# Patient Record
Sex: Female | Born: 1965 | Race: White | Hispanic: No | Marital: Married | Smoking: Former smoker
Health system: Southern US, Community
[De-identification: ages and names within clinical notes are randomized; demographics above are authoritative.]

## PROBLEM LIST (undated history)

## (undated) DIAGNOSIS — E785 Hyperlipidemia, unspecified: Secondary | ICD-10-CM

## (undated) DIAGNOSIS — Z803 Family history of malignant neoplasm of breast: Secondary | ICD-10-CM

## (undated) HISTORY — DX: Hyperlipidemia, unspecified: E78.5

## (undated) HISTORY — DX: Family history of malignant neoplasm of breast: Z80.3

## (undated) HISTORY — PX: WISDOM TOOTH EXTRACTION: SHX21

## (undated) HISTORY — PX: NO PAST SURGERIES: SHX2092

---

## 2007-07-31 ENCOUNTER — Emergency Department: Payer: Self-pay | Admitting: Internal Medicine

## 2009-01-09 ENCOUNTER — Inpatient Hospital Stay: Payer: Self-pay

## 2011-07-03 ENCOUNTER — Ambulatory Visit: Payer: Self-pay

## 2013-10-18 ENCOUNTER — Ambulatory Visit: Payer: Self-pay

## 2014-03-24 DIAGNOSIS — Z803 Family history of malignant neoplasm of breast: Secondary | ICD-10-CM

## 2014-03-24 HISTORY — DX: Family history of malignant neoplasm of breast: Z80.3

## 2015-01-02 ENCOUNTER — Ambulatory Visit: Payer: Self-pay

## 2015-07-24 ENCOUNTER — Encounter: Payer: Self-pay | Attending: Surgery | Admitting: Surgery

## 2015-07-24 DIAGNOSIS — L03317 Cellulitis of buttock: Secondary | ICD-10-CM | POA: Insufficient documentation

## 2015-07-24 DIAGNOSIS — Z87891 Personal history of nicotine dependence: Secondary | ICD-10-CM | POA: Insufficient documentation

## 2015-07-24 DIAGNOSIS — L98412 Non-pressure chronic ulcer of buttock with fat layer exposed: Secondary | ICD-10-CM | POA: Insufficient documentation

## 2015-07-24 DIAGNOSIS — X58XXXA Exposure to other specified factors, initial encounter: Secondary | ICD-10-CM | POA: Insufficient documentation

## 2015-07-24 DIAGNOSIS — S70362A Insect bite (nonvenomous), left thigh, initial encounter: Secondary | ICD-10-CM | POA: Insufficient documentation

## 2015-07-25 NOTE — Progress Notes (Signed)
Janice, [name] ([id number]) Visit Report for 07/24/2015 Allergy List Details Patient Name: [name], [name]. Date of Service: 07/24/2015 8:45 AM Medical Record Number: [id number] Patient Account Number: [id number] Date of Birth/Sex: Sep 29, 1966 (49 y.o. Female) Treating RN: Montey Hora Primary Care Physician: Other Clinician: Referring Physician: Briscoe Deutscher Treating Physician/Extender: Frann Rider in Treatment: 0 Allergies Active Allergies Bactrim penicillin Allergy Notes Electronic Signature(s) Signed: 07/24/2015 4:36:49 PM By: Montey Hora Entered By: Montey Hora on 07/24/2015 09:08:15 Hanley, Angelmarie Johnette Abraham ([id number]) -------------------------------------------------------------------------------- Arrival Information Details Patient Name: [name]. Date of Service: 07/24/2015 8:45 AM Medical Record Number: [id number] Patient Account Number: [id number] Date of Birth/Sex: Nov 19, 1965 (49 y.o. Female) Treating RN: Montey Hora Primary Care Physician: Other Clinician: Referring Physician: Briscoe Deutscher Treating Physician/Extender: Frann Rider in Treatment: 0 Visit Information Patient Arrived: Ambulatory Arrival Time: 09:06 Accompanied By: self Transfer Assistance: None Patient Identification Verified: Yes Secondary Verification Process Yes Completed: Electronic Signature(s) Signed: 07/24/2015 4:36:49 PM By: Montey Hora Entered By: Montey Hora on 07/24/2015 09:07:50 Cavanah, Blanchie E. ([id number]) -------------------------------------------------------------------------------- Clinic Level of Care Assessment Details Patient Name: [name]. Date of Service: 07/24/2015 8:45 AM Medical Record Number: [id number] Patient Account Number: [id number] Date of Birth/Sex: 28-Dec-1965 (49 y.o. Female) Treating RN: Montey Hora Primary Care Physician: Other Clinician: Referring Physician: Briscoe Deutscher Treating Physician/Extender:  Frann Rider in Treatment: 0 Clinic Level of Care Assessment Items TOOL 1 Quantity Score []  - Use when EandM and Procedure is performed on INITIAL visit 0 ASSESSMENTS - Nursing Assessment / Reassessment X - General Physical Exam (combine w/ comprehensive assessment (listed just 1 20 below) when performed on new pt. evals) X - Comprehensive Assessment (HX, ROS, Risk Assessments, Wounds Hx, etc.) 1 25 ASSESSMENTS - Wound and Skin Assessment / Reassessment []  - Dermatologic / Skin Assessment (not related to wound area) 0 ASSESSMENTS - Ostomy and/or Continence Assessment and Care []  - Incontinence Assessment and Management 0 []  - Ostomy Care Assessment and Management (repouching, etc.) 0 PROCESS - Coordination of Care X - Simple Patient / Family Education for ongoing care 1 15 []  - Complex (extensive) Patient / Family Education for ongoing care 0 X - Staff obtains Programmer, systems, Records, Test Results / Process Orders 1 10 []  - Staff telephones HHA, Nursing Homes / Clarify orders / etc 0 []  - Routine Transfer to another Facility (non-emergent condition) 0 []  - Routine Hospital Admission (non-emergent condition) 0 X - New Admissions / Biomedical engineer / Ordering NPWT, Apligraf, etc. 1 15 []  - Emergency Hospital Admission (emergent condition) 0 PROCESS - Special Needs []  - Pediatric / Minor Patient Management 0 []  - Isolation Patient Management 0 Korver, Lynnea E. ([id number]) []  - Hearing / Language / Visual special needs 0 []  - Assessment of Community assistance (transportation, D/C planning, etc.) 0 []  - Additional assistance / Altered mentation 0 []  - Support Surface(s) Assessment (bed, cushion, seat, etc.) 0 INTERVENTIONS - Miscellaneous []  - External ear exam 0 []  - Patient Transfer (multiple staff / Civil Service fast streamer / Similar devices) 0 []  - Simple Staple / Suture removal (25 or less) 0 []  - Complex Staple / Suture removal (26 or more) 0 []  - Hypo/Hyperglycemic Management  (do not check if billed separately) 0 []  - Ankle / Brachial Index (ABI) - do not check if billed separately 0 Has the patient been seen at the hospital within the last three years: Yes Total Score: 85 Level Of Care: New/Established - Level 3 Electronic Signature(s) Signed: 07/24/2015 4:36:49 PM By: Marjory Lies,  Di Kindle Entered By: Montey Hora on 07/24/2015 09:38:36 Lodato, Edwinna Areola ([id number]) -------------------------------------------------------------------------------- Encounter Discharge Information Details Patient Name: Janice, [name]. Date of Service: 07/24/2015 8:45 AM Medical Record Number: [id number] Patient Account Number: [id number] Date of Birth/Sex: 02-Nov-1966 (49 y.o. Female) Treating RN: Montey Hora Primary Care Physician: Other Clinician: Referring Physician: Briscoe Deutscher Treating Physician/Extender: Frann Rider in Treatment: 0 Encounter Discharge Information Items Discharge Pain Level: 0 Discharge Condition: Stable Ambulatory Status: Ambulatory Discharge Destination: Home Transportation: Private Auto Accompanied By: self Schedule Follow-up Appointment: Yes Medication Reconciliation completed and provided to Patient/Care No Ariany Kesselman: Provided on Clinical Summary of Care: 07/24/2015 Form Type Recipient Paper Patient [facility] Electronic Signature(s) Signed: 07/24/2015 4:36:49 PM By: Montey Hora Previous Signature: 07/24/2015 9:50:10 AM Version By: Ruthine Dose Entered By: Montey Hora on 07/24/2015 09:52:59 Greenlaw, Arrionna E. ([id number]) -------------------------------------------------------------------------------- Multi Wound Chart Details Patient Name: Janice Gong E. Date of Service: 07/24/2015 8:45 AM Medical Record Number: [id number] Patient Account Number: [id number] Date of Birth/Sex: 1966-05-06 (49 y.o. Female) Treating RN: Montey Hora Primary Care Physician: Other Clinician: Referring Physician: Briscoe Deutscher Treating  Physician/Extender: Frann Rider in Treatment: 0 Vital Signs Height(in): 65 Pulse(bpm): 73 Weight(lbs): 168 Blood Pressure 116/62 (mmHg): Body Mass Index(BMI): 28 Temperature(F): 98.0 Respiratory Rate 18 (breaths/min): Photos: [1:No Photos] [N/A:N/A] Wound Location: [1:Left Ischium - Lateral] [N/A:N/A] Wounding Event: [1:Bite] [N/A:N/A] Primary Etiology: [1:Cellulitis] [N/A:N/A] Date Acquired: [1:06/18/2015] [N/A:N/A] Weeks of Treatment: [1:0] [N/A:N/A] Wound Status: [1:Open] [N/A:N/A] Measurements L x W x D 3.5x5.7x0.2 [N/A:N/A] (cm) Area (cm) : [1:15.669] [N/A:N/A] Volume (cm) : [1:3.134] [N/A:N/A] % Reduction in Area: [1:0.00%] [N/A:N/A] % Reduction in Volume: 0.00% [N/A:N/A] Classification: [1:Full Thickness Without Exposed Support Structures] [N/A:N/A] Exudate Amount: [1:Small] [N/A:N/A] Exudate Type: [1:Purulent] [N/A:N/A] Exudate Color: [1:yellow, brown, green] [N/A:N/A] Wound Margin: [1:Flat and Intact] [N/A:N/A] Granulation Amount: [1:None Present (0%)] [N/A:N/A] Necrotic Amount: [1:Large (67-100%)] [N/A:N/A] Necrotic Tissue: [1:Eschar] [N/A:N/A] Exposed Structures: [1:Fascia: No Fat: No Tendon: No Muscle: No Joint: No Bone: No] [N/A:N/A] Limited to Skin Breakdown Epithelialization: None N/A N/A Periwound Skin Texture: Edema: No N/A N/A Excoriation: No Induration: No Callus: No Crepitus: No Fluctuance: No Friable: No Rash: No Scarring: No Periwound Skin Moist: Yes N/A N/A Moisture: Maceration: No Dry/Scaly: No Periwound Skin Color: Erythema: Yes N/A N/A Atrophie Blanche: No Cyanosis: No Ecchymosis: No Hemosiderin Staining: No Mottled: No Pallor: No Rubor: No Erythema Location: Circumferential N/A N/A Temperature: No Abnormality N/A N/A Tenderness on Yes N/A N/A Palpation: Wound Preparation: Ulcer Cleansing: N/A N/A Rinsed/Irrigated with Saline Topical Anesthetic Applied: Other: lidocaine 4% Treatment Notes Electronic  Signature(s) Signed: 07/24/2015 4:36:49 PM By: Montey Hora Entered By: Montey Hora on 07/24/2015 09:33:36 Thammavong, Edwinna Areola ([id number]) -------------------------------------------------------------------------------- [place] Details Patient Name: FRANK, PILGER. Date of Service: 07/24/2015 8:45 AM Medical Record Number: [id number] Patient Account Number: [id number] Date of Birth/Sex: 09/17/1966 (49 y.o. Female) Treating RN: Montey Hora Primary Care Physician: Other Clinician: Referring Physician: Briscoe Deutscher Treating Physician/Extender: Frann Rider in Treatment: 0 Active Inactive Necrotic Tissue Nursing Diagnoses: Impaired tissue integrity related to necrotic/devitalized tissue Goals: Necrotic/devitalized tissue will be minimized in the wound bed Date Initiated: 07/24/2015 Goal Status: Active Patient/caregiver will verbalize understanding of reason and process for debridement of necrotic tissue Date Initiated: 07/24/2015 Goal Status: Active Interventions: Assess patient pain level pre-, during and post procedure and prior to discharge Provide education on necrotic tissue and debridement process Treatment Activities: Apply topical anesthetic as ordered : 07/24/2015 Notes: Orientation to the Wound Care Program Nursing Diagnoses: Knowledge deficit related  to the wound healing center program Goals: Patient/caregiver will verbalize understanding of the [place] Program Date Initiated: 07/24/2015 Goal Status: Active Interventions: Provide education on orientation to the wound center Notes: KAYLEEANN, HUXFORD ([id number]) Wound/Skin Impairment Nursing Diagnoses: Impaired tissue integrity Goals: Ulcer/skin breakdown will have a volume reduction of 30% by week 4 Date Initiated: 07/24/2015 Goal Status: Active Ulcer/skin breakdown will have a volume reduction of 50% by week 8 Date Initiated: 07/24/2015 Goal Status:  Active Ulcer/skin breakdown will have a volume reduction of 80% by week 12 Date Initiated: 07/24/2015 Goal Status: Active Ulcer/skin breakdown will heal within 14 weeks Date Initiated: 07/24/2015 Goal Status: Active Interventions: Assess patient/caregiver ability to obtain necessary supplies Assess ulceration(s) every visit Notes: Electronic Signature(s) Signed: 07/24/2015 4:36:49 PM By: Montey Hora Entered By: Montey Hora on 07/24/2015 09:33:20 Gladney, Edwinna Areola ([id number]) -------------------------------------------------------------------------------- Patient/Caregiver Education Details Patient Name: [name]. Date of Service: 07/24/2015 8:45 AM Medical Record Number: [id number] Patient Account Number: [id number] Date of Birth/Gender: [birth year] (49 y.o. Female) Treating RN: Montey Hora Primary Care Physician: Other Clinician: Referring Physician: Briscoe Deutscher Treating Physician/Extender: Frann Rider in Treatment: 0 Education Assessment Education Provided To: Patient Education Topics Provided Wound/Skin Impairment: Handouts: Other: reoprtable s/s and wound care as ordered Methods: Demonstration, Explain/Verbal Responses: State content correctly Electronic Signature(s) Signed: 07/24/2015 4:36:49 PM By: Montey Hora Entered By: Montey Hora on 07/24/2015 09:53:19 Daponte, Anasia E. ([id number]) -------------------------------------------------------------------------------- Wound Assessment Details Patient Name: Nadara Mustard, Jahlisa E. Date of Service: 07/24/2015 8:45 AM Medical Record Number: [id number] Patient Account Number: [id number] Date of Birth/Sex: Jan 11, 1966 (49 y.o. Female) Treating RN: Montey Hora Primary Care Physician: Other Clinician: Referring Physician: Briscoe Deutscher Treating Physician/Extender: Frann Rider in Treatment: 0 Wound Status Wound Number: 1 Primary Etiology: Cellulitis Wound Location: Left Ischium -  Lateral Wound Status: Open Wounding Event: Bite Date Acquired: 06/18/2015 Weeks Of Treatment: 0 Clustered Wound: No Photos Photo Uploaded By: Montey Hora on 07/24/2015 16:32:56 Wound Measurements Length: (cm) 3.5 Width: (cm) 5.7 Depth: (cm) 0.2 Area: (cm) 15.669 Volume: (cm) 3.134 % Reduction in Area: 0% % Reduction in Volume: 0% Epithelialization: None Tunneling: No Undermining: No Wound Description Full Thickness Without Exposed Classification: Support Structures Wound Margin: Flat and Intact Exudate Small Amount: Exudate Type: Purulent Exudate Color: yellow, brown, green Foul Odor After Cleansing: No Wound Bed Granulation Amount: None Present (0%) Exposed Structure Necrotic Amount: Large (67-100%) Fascia Exposed: No Necrotic Quality: Eschar Fat Layer Exposed: No Mayweather, Anastazja E. ([id number]) Tendon Exposed: No Muscle Exposed: No Joint Exposed: No Bone Exposed: No Limited to Skin Breakdown Periwound Skin Texture Texture Color No Abnormalities Noted: No No Abnormalities Noted: No Callus: No Atrophie Blanche: No Crepitus: No Cyanosis: No Excoriation: No Ecchymosis: No Fluctuance: No Erythema: Yes Friable: No Erythema Location: Circumferential Induration: No Hemosiderin Staining: No Localized Edema: No Mottled: No Rash: No Pallor: No Scarring: No Rubor: No Moisture Temperature / Pain No Abnormalities Noted: No Temperature: No Abnormality Dry / Scaly: No Tenderness on Palpation: Yes Maceration: No Moist: Yes Wound Preparation Ulcer Cleansing: Rinsed/Irrigated with Saline Topical Anesthetic Applied: Other: lidocaine 4%, Treatment Notes Wound #1 (Left, Lateral Ischium) 1. Cleansed with: Clean wound with Normal Saline 2. Anesthetic Topical Lidocaine 4% cream to wound bed prior to debridement 4. Dressing Applied: Aquacel Ag 5. Secondary Dressing Applied ABD Pad 7. Secured with Microbiologist) Signed: 07/24/2015  4:36:49 PM By: Montey Hora Entered By: Montey Hora on 07/24/2015 09:26:26 Shatz, Edwinna Areola ([id number]) -------------------------------------------------------------------------------- [place] Details Patient Name: Nadara Mustard,  Allyce E. Date of Service: 07/24/2015 8:45 AM Medical Record Number: [id number] Patient Account Number: [id number] Date of Birth/Sex: [birth year] (49 y.o. Female) Treating RN: Montey Hora Primary Care Physician: Other Clinician: Referring Physician: Briscoe Deutscher Treating Physician/Extender: Frann Rider in Treatment: 0 Vital Signs Time Taken: 09:12 Temperature (F): 98.0 Height (in): 65 Pulse (bpm): 73 Source: Stated Respiratory Rate (breaths/min): 18 Weight (lbs): 168 Blood Pressure (mmHg): 116/62 Source: Stated Reference Range: 80 - 120 mg / dl Body Mass Index (BMI): 28 Electronic Signature(s) Signed: 07/24/2015 4:36:49 PM By: Montey Hora Entered By: Montey Hora on 07/24/2015 09:15:41

## 2015-07-25 NOTE — Progress Notes (Signed)
GENI, SKORUPSKI ([id number]) Visit Report for 07/24/2015 Abuse/Suicide Risk Screen Details Patient Name: [name]. Date of Service: 07/24/2015 8:45 AM Medical Record Number: [id number] Patient Account Number: [id number] Date of Birth/Sex: [birth year] (49 y.o. Female) Treating RN: Montey Hora Primary Care Physician: Other Clinician: Referring Physician: Briscoe Deutscher Treating Physician/Extender: Frann Rider in Treatment: 0 Abuse/Suicide Risk Screen Items Answer ABUSE/SUICIDE RISK SCREEN: Has anyone close to you tried to hurt or harm you recentlyo No Do you feel uncomfortable with anyone in your familyo No Has anyone forced you do things that you didnot want to doo No Do you have any thoughts of harming yourselfo No Patient displays signs or symptoms of abuse and/or neglect. No Electronic Signature(s) Signed: 07/24/2015 4:36:49 PM By: Montey Hora Entered By: Montey Hora on 07/24/2015 09:11:19 [name], [name]. ([id number]) -------------------------------------------------------------------------------- Activities of Daily Living Details Patient Name: [name], [name]. Date of Service: 07/24/2015 8:45 AM Medical Record Number: [id number] Patient Account Number: [id number] Date of Birth/Sex: 13-Feb-1966 (49 y.o. Female) Treating RN: Montey Hora Primary Care Physician: Other Clinician: Referring Physician: Briscoe Deutscher Treating Physician/Extender: Frann Rider in Treatment: 0 Activities of Daily Living Items Answer Activities of Daily Living (Please select one for each item) Drive Automobile Completely Able Take Medications Completely Able Use Telephone Completely Able Care for Appearance Completely Able Use Toilet Completely Able Bath / Shower Completely Able Dress Self Completely Able Feed Self Completely Able Walk Completely Able Get In / Out Bed Completely Able Housework Completely Able Prepare Meals Completely Able Handle Money  Completely Able Shop for Self Completely Able Electronic Signature(s) Signed: 07/24/2015 4:36:49 PM By: Montey Hora Entered By: Montey Hora on 07/24/2015 09:11:39 [name] ([id number]) -------------------------------------------------------------------------------- Education Assessment Details Patient Name: [name]. Date of Service: 07/24/2015 8:45 AM Medical Record Number: [id number] Patient Account Number: [id number] Date of Birth/Sex: 1966/03/30 (49 y.o. Female) Treating RN: Montey Hora Primary Care Physician: Other Clinician: Referring Physician: Briscoe Deutscher Treating Physician/Extender: Frann Rider in Treatment: 0 Learning Preferences/Education Level/Primary Language Learning Preference: Explanation, Demonstration Highest Education Level: College or Above Preferred Language: English Cognitive Barrier Assessment/Beliefs Language Barrier: No Translator Needed: No Memory Deficit: No Emotional Barrier: No Cultural/Religious Beliefs Affecting Medical No Care: Physical Barrier Assessment Impaired Vision: No Impaired Hearing: No Decreased Hand dexterity: No Knowledge/Comprehension Assessment Knowledge Level: Medium Comprehension Level: Medium Ability to understand written Medium instructions: Ability to understand verbal Medium instructions: Motivation Assessment Anxiety Level: Calm Cooperation: Cooperative Education Importance: Acknowledges Need Interest in Health Problems: Asks Questions Perception: Coherent Willingness to Engage in Self- Medium Management Activities: Readiness to Engage in Self- Medium Management Activities: Electronic Signature(s) Signed: 07/24/2015 4:36:49 PM By: Janice Grandchild, [name] Kitchen ([id number]) Entered By: Montey Hora on 07/24/2015 09:12:05 [name] ([id number]) -------------------------------------------------------------------------------- Fall Risk Assessment  Details Patient Name: [name]. Date of Service: 07/24/2015 8:45 AM Medical Record Number: [id number] Patient Account Number: [id number] Date of Birth/Sex: 05-Dec-1965 (49 y.o. Female) Treating RN: Montey Hora Primary Care Physician: Other Clinician: Referring Physician: Briscoe Deutscher Treating Physician/Extender: Frann Rider in Treatment: 0 Fall Risk Assessment Items FALL RISK ASSESSMENT: History of falling - immediate or within 3 months 0 No Secondary diagnosis 0 No Ambulatory aid None/bed rest/wheelchair/nurse 0 Yes Crutches/cane/walker 0 No Furniture 0 No IV Access/Saline Lock 0 No Gait/Training Normal/bed rest/immobile 0 Yes Weak 0 No Impaired 0 No Mental Status Oriented to own ability 0 Yes Electronic Signature(s) Signed: 07/24/2015 4:36:49 PM By: Montey Hora Entered By: Montey Hora on 07/24/2015 09:12:16  [name] ([id number]) -------------------------------------------------------------------------------- Nutrition Risk Assessment Details Patient Name: [name], ITEN. Date of Service: 07/24/2015 8:45 AM Medical Record Number: [id number] Patient Account Number: [id number] Date of Birth/Sex: Mar 11, 1966 (49 y.o. Female) Treating RN: Montey Hora Primary Care Physician: Other Clinician: Referring Physician: Briscoe Deutscher Treating Physician/Extender: Frann Rider in Treatment: 0 Height (in): Weight (lbs): Body Mass Index (BMI): Nutrition Risk Assessment Items NUTRITION RISK SCREEN: I have an illness or condition that made me change the kind and/or 0 No amount of food I eat I eat fewer than two meals per day 0 No I eat few fruits and vegetables, or milk products 0 No I have three or more drinks of beer, liquor or wine almost every day 0 No I have tooth or mouth problems that make it hard for me to eat 0 No I don't always have enough money to buy the food I need 0 No I eat alone most of the time 0 No I take three or more  different prescribed or over-the-counter drugs a 0 No day Without wanting to, I have lost or gained 10 pounds in the last six 0 No months I am not always physically able to shop, cook and/or feed myself 0 No Nutrition Protocols Good Risk Protocol 0 No interventions needed Moderate Risk Protocol Electronic Signature(s) Signed: 07/24/2015 4:36:49 PM By: Montey Hora Entered By: Montey Hora on 07/24/2015 09:12:26

## 2015-07-25 NOTE — Progress Notes (Signed)
TORREY, HORSEMAN ([id number]) Visit Report for 07/24/2015 Chief Complaint Document Details Patient Name: [name], [name]. Date of Service: 07/24/2015 8:45 AM Medical Record Number: [id number] Patient Account Number: [id number] Date of Birth/Sex: 25-Oct-1966 (49 y.o. Female) Treating RN: Montey Hora Primary Care Physician: Other Clinician: Referring Physician: Briscoe Deutscher Treating Physician/Extender: Frann Rider in Treatment: 0 Information Obtained from: Patient Chief Complaint Patient presents to the wound care center for a consult due non healing wound. She has had a wound on her left upper thigh and buttock region for about a month. Electronic Signature(s) Signed: 07/24/2015 9:56:02 AM By: Christin Fudge MD, FACS Entered By: Christin Fudge on 07/24/2015 09:56:02 Hudler, [name] ([id number]) -------------------------------------------------------------------------------- Debridement Details Patient Name: [name]. Date of Service: 07/24/2015 8:45 AM Medical Record Number: [id number] Patient Account Number: [id number] Date of Birth/Sex: [birth year] (49 y.o. Female) Treating RN: Montey Hora Primary Care Physician: Other Clinician: Referring Physician: Briscoe Deutscher Treating Physician/Extender: Frann Rider in Treatment: 0 Debridement Performed for Wound #1 Left,Lateral Ischium Assessment: Performed By: Physician Pat Patrick., MD Debridement: Debridement Pre-procedure Yes Verification/Time Out Taken: Start Time: 09:36 Pain Control: Lidocaine 4% Topical Solution Level: Skin/Subcutaneous Tissue Total Area Debrided (L x 3.5 (cm) x 5.7 (cm) = 19.95 (cm) W): Tissue and other Viable, Non-Viable, Eschar, Fibrin/Slough, Subcutaneous material debrided: Instrument: Forceps, Scissors Bleeding: Minimum Hemostasis Achieved: Pressure End Time: 09:39 Procedural Pain: 0 Post Procedural Pain: 0 Response to Treatment: Procedure was tolerated  well Post Debridement Measurements of Total Wound Length: (cm) 3.5 Width: (cm) 5.7 Depth: (cm) 0.2 Volume: (cm) 3.134 Post Procedure Diagnosis Same as Pre-procedure Electronic Signature(s) Signed: 07/24/2015 9:55:30 AM By: Christin Fudge MD, FACS Signed: 07/24/2015 4:36:49 PM By: Montey Hora Entered By: Christin Fudge on 07/24/2015 09:55:30 [name], [name] ([id number]) -------------------------------------------------------------------------------- HPI Details Patient Name: Janice Gong E. Date of Service: 07/24/2015 8:45 AM Medical Record Number: [id number] Patient Account Number: [id number] Date of Birth/Sex: Nov 23, 1965 (49 y.o. Female) Treating RN: Montey Hora Primary Care Physician: Other Clinician: Referring Physician: Briscoe Deutscher Treating Physician/Extender: Frann Rider in Treatment: 0 History of Present Illness Location: left lateral upper thigh and hip region Quality: Patient reports experiencing a dull pain to affected area(s). Severity: Patient states wound are getting worse. Duration: Patient has had the wound for < 4 weeks prior to presenting for treatment Timing: Pain in wound is Intermittent (comes and goes Context: The wound occurred when the patient possibly had a insect bite to the left hip and buttock region Modifying Factors: Other treatment(s) tried include: local care was done and recently was put on doxycycline for this. Associated Signs and Symptoms: Wound has purlulent drainage HPI Description: She has no comorbidities and was initially thought to have a insect bite which gradually caused a large purulent abscess with drainage. Due to lack of insurance she did not seek any medical help but continued over-the-counter remedies for this. She was recently seen by her PCP Dr. Briscoe Deutscher who reviewed her wound and besides putting on doxycycline was sent to the wound center. Electronic Signature(s) Signed: 07/24/2015 9:58:32 AM By: Christin Fudge MD, FACS Entered By: Christin Fudge on 07/24/2015 09:58:32 [name], [name] ([id number]) -------------------------------------------------------------------------------- Physical Exam Details Patient Name: [name], [name] Date of Service: 07/24/2015 8:45 AM Medical Record Number: [id number] Patient Account Number: [id number] Date of Birth/Sex: 04/12/1966 (49 y.o. Female) Treating RN: Montey Hora Primary Care Physician: Other Clinician: Referring Physician: Briscoe Deutscher Treating Physician/Extender: Frann Rider in Treatment: 0 Constitutional . Pulse regular. Respirations normal  and unlabored. Afebrile. . Eyes Nonicteric. Reactive to light. Ears, Nose, Mouth, and Throat Lips, teeth, and gums WNL.Marland Kitchen Moist mucosa without lesions . Neck supple and nontender. No palpable supraclavicular or cervical adenopathy. Normal sized without goiter. Respiratory WNL. No retractions.. Cardiovascular Pedal Pulses WNL. No clubbing, cyanosis or edema. Lymphatic No adneopathy. No adenopathy. No adenopathy. Musculoskeletal Adexa without tenderness or enlargement.. Digits and nails w/o clubbing, cyanosis, infection, petechiae, ischemia, or inflammatory conditions.. Integumentary (Hair, Skin) No suspicious lesions. No crepitus or fluctuance. No peri-wound warmth or erythema. No masses.Marland Kitchen Psychiatric Judgement and insight Intact.. No evidence of depression, anxiety, or agitation.. Notes the wound is covered with a large amount of necrotic eschar and subcutaneous debris which will need sharp debridement with forceps and scissors. Electronic Signature(s) Signed: 07/24/2015 9:59:02 AM By: Christin Fudge MD, FACS Entered By: Christin Fudge on 07/24/2015 09:59:02 [name] ([id number]) -------------------------------------------------------------------------------- Physician Orders Details Patient Name: [name]. Date of Service: 07/24/2015 8:45 AM Medical Record  Number: [id number] Patient Account Number: [id number] Date of Birth/Sex: 09/03/1966 (49 y.o. Female) Treating RN: Montey Hora Primary Care Physician: Other Clinician: Referring Physician: Briscoe Deutscher Treating Physician/Extender: Frann Rider in Treatment: 0 Verbal / Phone Orders: Yes Clinician: Montey Hora Read Back and Verified: Yes Diagnosis Coding Wound Cleansing Wound #1 Left,Lateral Ischium o Clean wound with Normal Saline. o May Shower, gently pat wound dry prior to applying new dressing. Anesthetic Wound #1 Left,Lateral Ischium o Topical Lidocaine 4% cream applied to wound bed prior to debridement Primary Wound Dressing Wound #1 Left,Lateral Ischium o Aquacel Ag Secondary Dressing Wound #1 Left,Lateral Ischium o ABD pad - secure with paper tape Dressing Change Frequency Wound #1 Left,Lateral Ischium o Change dressing every other day. Follow-up Appointments Wound #1 Left,Lateral Ischium o Return Appointment in 1 week. Electronic Signature(s) Signed: 07/24/2015 4:01:57 PM By: Christin Fudge MD, FACS Signed: 07/24/2015 4:36:49 PM By: Montey Hora Entered By: Montey Hora on 07/24/2015 09:52:20 Benavidez, Khamya EMarland Kitchen ([id number]) -------------------------------------------------------------------------------- Problem List Details Patient Name: TEONA, VARGUS E. Date of Service: 07/24/2015 8:45 AM Medical Record Number: [id number] Patient Account Number: [id number] Date of Birth/Sex: 1966-07-06 (49 y.o. 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 ([id number]) -------------------------------------------------------------------------------- Progress Note Details Patient Name: [name]. Date of Service: 07/24/2015 8:45 AM Medical Record Number: [id number] Patient Account Number: [id number] Date of Birth/Sex: October 30, 1966 (49 y.o. Female) Treating RN: Montey Hora Primary Care Physician: Other Clinician: Referring Physician: Briscoe Deutscher Treating Physician/Extender: Frann Rider in Treatment: 0 Subjective Chief Complaint Information obtained from Patient Patient presents to the wound care center for a consult due non healing wound. She has had a wound on her left upper thigh and buttock region for about a month. History of Present Illness (HPI) The following HPI elements were documented for the patient's wound: Location: left lateral upper thigh and hip region Quality: Patient reports experiencing a dull pain to affected area(s). Severity: Patient states wound are getting worse. Duration: Patient has had the wound for < 4 weeks prior to presenting for treatment Timing: Pain in wound  is Intermittent (comes and goes Context: The wound occurred when the patient possibly had a insect bite to the left hip and buttock region Modifying Factors: Other treatment(s) tried include: local care was done and recently was put on doxycycline for this. Associated Signs and Symptoms: Wound has purlulent drainage She has no comorbidities and was initially thought to have a insect bite which gradually caused a large purulent abscess with drainage. Due to lack of insurance she did not seek any medical help but continued over-the-counter remedies for this. She was recently seen by her PCP Dr. Briscoe Deutscher who reviewed her wound and  besides putting on doxycycline was sent to the wound center. Wound History Patient presents with 1 open wound that has been present for approximately since August7. Patient has been treating wound in the following manner: TAO and bandage. Laboratory tests have not been performed in the last month. Patient reportedly has not tested positive for an antibiotic resistant organism. Patient reportedly has not tested positive for osteomyelitis. Patient reportedly has not had testing performed to evaluate circulation in the legs. Patient experiences the following problems associated with their wounds: infection, swelling. Patient History Information obtained from Patient. Allergies Bactrim, penicillin [name], [name] ([id number]) Family History Cancer - Father, Heart Disease - Paternal Grandparents, Maternal Grandparents, Hypertension - Maternal Grandparents, Paternal Grandparents, No family history of Diabetes, Hereditary Spherocytosis, Kidney Disease, Lung Disease, Seizures, Stroke, Thyroid Problems, Tuberculosis. Social History Former smoker - 20+ years ago, Marital Status - Married, Alcohol Use - Rarely, Drug Use - No History, Caffeine Use - Never. Medical History Oncologic Denies history of Received Chemotherapy, Received Radiation Review of Systems (ROS) Constitutional Symptoms (General Health) The patient has no complaints or symptoms. Eyes The patient has no complaints or symptoms. Ear/Nose/Mouth/Throat The patient has no complaints or symptoms. Hematologic/Lymphatic The patient has no complaints or symptoms. Respiratory The patient has no complaints or symptoms. Cardiovascular The patient has no complaints or symptoms. Gastrointestinal The patient has no complaints or symptoms. Endocrine The patient has no complaints or symptoms. Genitourinary The patient has no complaints or symptoms. Immunological The patient has no complaints or symptoms. Integumentary (Skin) The  patient has no complaints or symptoms. Musculoskeletal The patient has no complaints or symptoms. Neurologic The patient has no complaints or symptoms. Oncologic The patient has no complaints or symptoms. Psychiatric The patient has no complaints or symptoms. Medications [name], [name] ([id number]) ibuprofen 200 mg tablet oral 1 1 tablet oral doxycycline hyclate 100 mg capsule oral 1 1 capsule oral Objective Constitutional Pulse regular. Respirations normal and unlabored. Afebrile. Vitals Time Taken: 9:12 AM, Height: 65 in, Source: Stated, Weight: 168 lbs, Source: Stated, BMI: 28, Temperature: 98.0 F, Pulse: 73 bpm, Respiratory Rate: 18 breaths/min, Blood Pressure: 116/62 mmHg. Eyes Nonicteric. Reactive to light. Ears, Nose, Mouth, and Throat Lips, teeth, and gums WNL.Marland Kitchen Moist mucosa without lesions . Neck supple and nontender. No palpable supraclavicular or cervical adenopathy. Normal sized without goiter. Respiratory WNL. No retractions.. Cardiovascular Pedal Pulses WNL. No clubbing, cyanosis or edema. Lymphatic No adneopathy. No adenopathy. No adenopathy. Musculoskeletal Adexa without tenderness or enlargement.. Digits and nails w/o clubbing, cyanosis, infection, petechiae, ischemia, or inflammatory conditions.Marland Kitchen Psychiatric Judgement and insight Intact.. No evidence of depression, anxiety, or agitation.. General Notes: the wound is covered with a large amount of necrotic eschar and subcutaneous debris which will need sharp debridement with forceps and scissors. Integumentary (Hair, Skin) No suspicious lesions. No crepitus or fluctuance. No peri-wound warmth or erythema. No masses.. Wound #1  status is Open. Original cause of wound was Bite. The wound is located on the Left,Lateral Ischium. The wound measures 3.5cm length x 5.7cm width x 0.2cm depth; 15.669cm^2 area and Bollig, [name] ([id number]) 3.134cm^3 volume. The wound is limited to skin breakdown. There is  no tunneling or undermining noted. There is a small amount of purulent drainage noted. The wound margin is flat and intact. There is no granulation within the wound bed. There is a large (67-100%) amount of necrotic tissue within the wound bed including Eschar. The periwound skin appearance exhibited: Moist, Erythema. The periwound skin appearance did not exhibit: Callus, Crepitus, Excoriation, Fluctuance, Friable, Induration, Localized Edema, Rash, Scarring, Dry/Scaly, Maceration, Atrophie Blanche, Cyanosis, Ecchymosis, Hemosiderin Staining, Mottled, Pallor, Rubor. The surrounding wound skin color is noted with erythema which is circumferential. Periwound temperature was noted as No Abnormality. The periwound has tenderness on palpation. 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 wound care and appropriate hygiene has been discussed with her and she will come and see me next week. Procedures Wound #1 Wound #1 is a Cellulitis located on the Left,Lateral Ischium . There was a Skin/Subcutaneous Tissue Debridement ([id number]) debridement with total area of 19.95 sq cm performed by Dalton Mille, Jackson Latino., MD. with the following instrument(s): Forceps and Scissors to remove Viable and Non-Viable tissue/material including Fibrin/Slough, Eschar, and Subcutaneous after achieving pain control using Lidocaine 4% Topical Solution. A time out was conducted prior to the start of the procedure. A Minimum amount of bleeding was controlled with Pressure. The procedure was tolerated well with a pain level of 0 throughout and a pain level of 0 following the procedure. Post Debridement Measurements: 3.5cm length x 5.7cm width x 0.2cm  depth; 3.134cm^3 volume. Post procedure Diagnosis Wound #1: Same as Pre-Procedure [name], [name] ([id number]) Plan Wound Cleansing: Wound #1 Left,Lateral Ischium: Clean wound with Normal Saline. May Shower, gently pat wound dry prior to applying new dressing. Anesthetic: Wound #1 Left,Lateral Ischium: Topical Lidocaine 4% cream applied to wound bed prior to debridement Primary Wound Dressing: Wound #1 Left,Lateral Ischium: Aquacel Ag Secondary Dressing: Wound #1 Left,Lateral Ischium: ABD pad - secure with paper tape Dressing Change Frequency: Wound #1 Left,Lateral Ischium: Change dressing every other day. Follow-up Appointments: Wound #1 Left,Lateral Ischium: Return Appointment in 1 week. After sharply debriding all the necrotic tissue I have recommended silver alginate to be changed twice a week. wound care and appropriate hygiene has been discussed with her and she will come and see me next week. Electronic Signature(s) Signed: 07/24/2015 10:00:06 AM By: Christin Fudge MD, FACS Entered By: Christin Fudge on 07/24/2015 10:00:06 [name] ([id number]) -------------------------------------------------------------------------------- ROS/PFSH Details Patient Name: [name]. Date of Service: 07/24/2015 8:45 AM Medical Record Number: [id number] Patient Account Number: [id number] Date of Birth/Sex: [birth year] (49 y.o. Female) Treating RN: Montey Hora Primary Care Physician: Other Clinician: Referring Physician: Briscoe Deutscher Treating Physician/Extender: Frann Rider in Treatment: 0 Information Obtained From Patient Wound History Do you currently have one or more open woundso Yes How many open wounds do you currently haveo 1 Approximately how long have you had your woundso since August7 How have you been treating your wound(s) until nowo TAO and bandage Has your wound(s) ever healed and then re-openedo No Have you had any  lab work done in the  past montho No Have you tested positive for an antibiotic resistant organism (MRSA, VRE)o No Have you tested positive for osteomyelitis (bone infection)o No Have you had any tests for circulation on your legso No Have you had other problems associated with your woundso Infection, Swelling Constitutional Symptoms (General Health) Complaints and Symptoms: No Complaints or Symptoms Eyes Complaints and Symptoms: No Complaints or Symptoms Ear/Nose/Mouth/Throat Complaints and Symptoms: No Complaints or Symptoms Hematologic/Lymphatic Complaints and Symptoms: No Complaints or Symptoms Respiratory Complaints and Symptoms: No Complaints or Symptoms Cardiovascular Riggsbee, [name] ([id number]) Complaints and Symptoms: No Complaints or Symptoms Gastrointestinal Complaints and Symptoms: No Complaints or Symptoms Endocrine Complaints and Symptoms: No Complaints or Symptoms Genitourinary Complaints and Symptoms: No Complaints or Symptoms Immunological Complaints and Symptoms: No Complaints or Symptoms Integumentary (Skin) Complaints and Symptoms: No Complaints or Symptoms Musculoskeletal Complaints and Symptoms: No Complaints or Symptoms Neurologic Complaints and Symptoms: No Complaints or Symptoms Oncologic Complaints and Symptoms: No Complaints or Symptoms Medical History: Negative for: Received Chemotherapy; Received Radiation Psychiatric Complaints and Symptoms: No Complaints or Symptoms Immunizations [name], [name] ([id number]) Immunization Notes: up to date Family and Social History Cancer: Yes - Father; Diabetes: No; Heart Disease: Yes - Paternal Grandparents, Maternal Grandparents; Hereditary Spherocytosis: No; Hypertension: Yes - Maternal Grandparents, Paternal Grandparents; Kidney Disease: No; Lung Disease: No; Seizures: No; Stroke: No; Thyroid Problems: No; Tuberculosis: No; Former smoker - 20+ years ago; Marital Status - Married; Alcohol Use: Rarely;  Drug Use: No History; Caffeine Use: Never; Financial Concerns: No; Food, Clothing or Shelter Needs: No; Support System Lacking: No; Transportation Concerns: No; Advanced Directives: No; Patient does not want information on Advanced Directives Physician Affirmation I have reviewed and agree with the above information. Electronic Signature(s) Signed: 07/24/2015 9:25:36 AM By: Christin Fudge MD, FACS Signed: 07/24/2015 4:36:49 PM By: Montey Hora Entered By: Christin Fudge on 07/24/2015 09:25:35 [name], Janice EMarland Kitchen ([id number]) -------------------------------------------------------------------------------- SuperBill Details Patient Name: MARGRETTA, ZAMORANO E. Date of Service: 07/24/2015 Medical Record Number: [id number] Patient Account Number: [id number] Date of Birth/Sex: 03-27-1966 (49 y.o. Female) Treating RN: Montey Hora Primary Care Physician: Other Clinician: Referring Physician: Briscoe Deutscher Treating Physician/Extender: Frann Rider in Treatment: 0 Diagnosis Coding ICD-10 Codes Code Description L03.317 Cellulitis of buttock L98.412 Non-pressure chronic ulcer of buttock with fat layer exposed S70.362A Insect bite (nonvenomous), left thigh, initial encounter Facility Procedures CPT4 Code: 17793903 Description: 99213 - WOUND CARE VISIT-LEV 3 EST PT Modifier: Quantity: 1 CPT4 Code: 00923300 Description: 76226 - DEB SUBQ TISSUE 20 SQ CM/< ICD-10 Description Diagnosis L03.317 Cellulitis of buttock L98.412 Non-pressure chronic ulcer of buttock with fat la S70.362A Insect bite (nonvenomous), left thigh, initial en Modifier: yer exposed counter Quantity: 1 Physician Procedures CPT4 Code: 3335456 Description: WC PHYS LEVEL 3 o NEW PT ICD-10 Description Diagnosis L03.317 Cellulitis of buttock L98.412 Non-pressure chronic ulcer of buttock with fat l S70.362A Insect bite (nonvenomous), left thigh, initial e Modifier: ayer exposed ncounter Quantity: 1 CPT4 Code: [id number] Henriques,  MICHE Description: 11042 - WC PHYS SUBQ TISS 20 SQ CM ICD-10 Description Diagnosis L03.317 Cellulitis of buttock L98.412 Non-pressure chronic ulcer of buttock with fat l S70.362A Insect bite (nonvenomous), left thigh, initial e LLE E. ([id number]) Modifier: ayer exposed ncounter Quantity: 1 Electronic Signature(s) Signed: 07/24/2015 10:00:44 AM By: Christin Fudge MD, FACS Previous Signature: 07/24/2015 10:00:17 AM Version By: Christin Fudge MD, FACS Entered By: Christin Fudge on 07/24/2015 10:00:44

## 2015-07-31 ENCOUNTER — Encounter: Payer: Self-pay | Admitting: Surgery

## 2015-08-01 NOTE — Progress Notes (Signed)
[name] ([id number]) Visit Report for 07/31/2015 Chief Complaint Document Details Patient Name: [name], [name]. Date of Service: 07/31/2015 9:30 AM Medical Record Number: [id number] Patient Account Number: [id number] Date of Birth/Sex: 1966-04-19 (49 y.o. Female) Treating RN: Cornell Barman Primary Care Physician: Briscoe Deutscher Other Clinician: Referring Physician: Briscoe Deutscher Treating Physician/Extender: Frann Rider in Treatment: 1 Information Obtained from: Patient Chief Complaint Patient presents to the wound care center for a consult due non healing wound. She has had a wound on her left upper thigh and buttock region for about a month. Electronic Signature(s) Signed: 07/31/2015 10:11:31 AM By: Christin Fudge MD, FACS Entered By: Christin Fudge on 07/31/2015 10:11:31 [name] ([id number]) -------------------------------------------------------------------------------- HPI Details Patient Name: [name]. Date of Service: 07/31/2015 9:30 AM Medical Record Number: [id number] Patient Account Number: [id number] Date of Birth/Sex: 1966/01/02 (49 y.o. Female) Treating RN: Cornell Barman Primary Care Physician: Briscoe Deutscher Other Clinician: Referring Physician: Briscoe Deutscher Treating Physician/Extender: Frann Rider in Treatment: 1 History of Present Illness Location: left lateral upper thigh and hip region Quality: Patient reports experiencing a dull pain to affected area(s). Severity: Patient states wound are getting worse. Duration: Patient has had the wound for < 4 weeks prior to presenting for treatment Timing: Pain in wound is Intermittent (comes and goes Context: The wound occurred when the patient possibly had a insect bite to the left hip and buttock region Modifying Factors: Other treatment(s) tried include: local care was done and recently was put on doxycycline for this. Associated Signs and Symptoms: Wound has purlulent  drainage HPI Description: She has no comorbidities and was initially thought to have a insect bite which gradually caused a large purulent abscess with drainage. Due to lack of insurance she did not seek any medical help but continued over-the-counter remedies for this. She was recently seen by her PCP Dr. Briscoe Deutscher who reviewed her wound and besides putting on doxycycline was sent to the wound center. Electronic Signature(s) Signed: 07/31/2015 10:11:36 AM By: Christin Fudge MD, FACS Entered By: Christin Fudge on 07/31/2015 10:11:35 [name] ([id number]) -------------------------------------------------------------------------------- Physical Exam Details Patient Name: [name], [name] Date of Service: 07/31/2015 9:30 AM Medical Record Number: [id number] Patient Account Number: [id number] Date of Birth/Sex: [birth year] (49 y.o. Female) Treating RN: Cornell Barman Primary Care Physician: Briscoe Deutscher Other Clinician: Referring Physician: Briscoe Deutscher Treating Physician/Extender: Frann Rider in Treatment: 1 Constitutional . Pulse regular. Respirations normal and unlabored. Afebrile. . Eyes Nonicteric. Reactive to light. Ears, Nose, Mouth, and Throat Lips, teeth, and gums WNL.Marland Kitchen Moist mucosa without lesions . Neck supple and nontender. No palpable supraclavicular or cervical adenopathy. Normal sized without goiter. Respiratory WNL. No retractions.. Cardiovascular Pedal Pulses WNL. No clubbing, cyanosis or edema. Lymphatic No adneopathy. No adenopathy. No adenopathy. Musculoskeletal Adexa without tenderness or enlargement.. Digits and nails w/o clubbing, cyanosis, infection, petechiae, ischemia, or inflammatory conditions.. Integumentary (Hair, Skin) No suspicious lesions. No crepitus or fluctuance. No peri-wound warmth or erythema. No masses.Marland Kitchen Psychiatric Judgement and insight Intact.. No evidence of depression, anxiety, or agitation.. Notes The wound on the  left hip is looking excellent with good clean granulating tissue and no debridement is required today. Electronic Signature(s) Signed: 07/31/2015 10:12:10 AM By: Christin Fudge MD, FACS Entered By: Christin Fudge on 07/31/2015 10:12:10 [name] ([id number]) -------------------------------------------------------------------------------- Physician Orders Details Patient Name: [name]. Date of Service: 07/31/2015 9:30 AM Medical Record Number: [id number] Patient Account Number: [id number] Date of Birth/Sex: [birth year] (49 y.o. Female) Treating RN:  Cornell Barman Primary Care Physician: Briscoe Deutscher Other Clinician: Referring Physician: Briscoe Deutscher Treating Physician/Extender: Frann Rider in Treatment: 1 Verbal / Phone Orders: Yes Clinician: Cornell Barman Read Back and Verified: Yes Diagnosis Coding Wound Cleansing Wound #1 Left,Lateral Ischium o Clean wound with Normal Saline. Anesthetic Wound #1 Left,Lateral Ischium o Topical Lidocaine 4% cream applied to wound bed prior to debridement Primary Wound Dressing Wound #1 Left,Lateral Ischium o Hydrafera Blue Secondary Dressing Wound #1 Left,Lateral Ischium o ABD pad Dressing Change Frequency Wound #1 Left,Lateral Ischium o Change dressing every other day. Follow-up Appointments Wound #1 Left,Lateral Ischium o Return Appointment in 1 week. Electronic Signature(s) Signed: 07/31/2015 3:58:37 PM By: Christin Fudge MD, FACS Signed: 07/31/2015 4:49:24 PM By: Gretta Cool RN, BSN, Kim RN, BSN Entered By: Gretta Cool, RN, BSN, Kim on 07/31/2015 10:04:48 Analysia, Dungee Edwinna Areola ([id number]) -------------------------------------------------------------------------------- Problem List Details Patient Name: [name]. Date of Service: 07/31/2015 9:30 AM Medical Record Number: [id number] Patient Account Number: [id number] Date of Birth/Sex: 05-17-1966 (49 y.o. 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 ([id number]) -------------------------------------------------------------------------------- Progress Note Details Patient Name: [name]. Date of Service: 07/31/2015 9:30 AM Medical Record Number: [id number] Patient Account Number: [id number] Date of Birth/Sex: 06-09-1966 (49 y.o. Female) Treating RN: Cornell Barman Primary Care Physician: Briscoe Deutscher Other Clinician: Referring Physician: Briscoe Deutscher Treating Physician/Extender: Frann Rider in Treatment: 1 Subjective Chief Complaint Information obtained from Patient Patient presents to the wound care center for a consult due non healing wound. She has had a wound on her left upper thigh and buttock region for about a month. History of Present Illness (HPI) The following HPI elements were documented for the patient's wound: Location: left lateral upper thigh and hip region Quality: Patient reports experiencing a dull pain to affected area(s). Severity: Patient states wound are getting worse. Duration: Patient has had the wound for < 4 weeks prior to presenting for treatment Timing: Pain in wound is Intermittent (comes and goes Context: The wound occurred when the patient possibly had a insect bite to the left hip and buttock region Modifying Factors: Other treatment(s) tried include: local care was done and recently was  put on doxycycline for this. Associated Signs and Symptoms: Wound has purlulent drainage She has no comorbidities and was initially thought to have a insect bite which gradually caused a large purulent abscess with drainage. Due to lack of insurance she did not seek any medical help but continued over-the-counter remedies for this. She was recently seen by her PCP Dr. Briscoe Deutscher who reviewed her wound and besides putting on doxycycline was sent to the wound center. Objective Constitutional Pulse regular. Respirations normal and unlabored. Afebrile. Vitals Time Taken: 9:39 AM, Height: 65 in, Weight: 168 lbs, BMI: 28, Temperature: 98.1 F, Pulse: 70 bpm, Respiratory Rate: 16 breaths/min, Blood Pressure: 122/70 mmHg. Eyes Nonicteric. Reactive to light. Mcclanahan, [place] ([id number]) Ears, Nose, Mouth, and Throat Lips, teeth, and gums WNL.Marland Kitchen Moist mucosa without lesions . Neck supple and nontender. No palpable supraclavicular or cervical adenopathy. Normal sized without  goiter. Respiratory WNL. No retractions.. Cardiovascular Pedal Pulses WNL. No clubbing, cyanosis or edema. Lymphatic No adneopathy. No adenopathy. No adenopathy. Musculoskeletal Adexa without tenderness or enlargement.. Digits and nails w/o clubbing, cyanosis, infection, petechiae, ischemia, or inflammatory conditions.Marland Kitchen Psychiatric Judgement and insight Intact.. No evidence of depression, anxiety, or agitation.. General Notes: The wound on the left hip is looking excellent with good clean granulating tissue and no debridement is required today. Integumentary (Hair, Skin) No suspicious lesions. No crepitus or fluctuance. No peri-wound warmth or erythema. No masses.. Wound #1 status is Open. Original cause of wound was Bite. The wound is located on the Left,Lateral Ischium. The wound measures 3.1cm length x 5cm width x 0.2cm depth; 12.174cm^2 area and 2.435cm^3 volume. The wound is limited to skin breakdown. There is  no tunneling or undermining noted. There is a small amount of serous drainage noted. The wound margin is flat and intact. There is large (67-100%) red granulation within the wound bed. There is a small (1-33%) amount of necrotic tissue within the wound bed including Eschar. The periwound skin appearance exhibited: Moist. The periwound skin appearance did not exhibit: Callus, Crepitus, Excoriation, Fluctuance, Friable, Induration, Localized Edema, Rash, Scarring, Dry/Scaly, Maceration, Atrophie Blanche, Cyanosis, Ecchymosis, Hemosiderin Staining, Mottled, Pallor, Rubor, Erythema. Periwound temperature was noted as No Abnormality. The periwound has tenderness on palpation. Assessment Active Problems ICD-10 L03.317 - Cellulitis of buttock Marcou, Breonia E. 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 She will come back and see me next week. Plan Wound Cleansing: Wound #1 Left,Lateral Ischium: Clean wound with Normal Saline. Anesthetic: Wound #1 Left,Lateral Ischium: Topical Lidocaine 4% cream applied to wound bed prior to debridement Primary Wound Dressing: Wound #1 Left,Lateral Ischium: Hydrafera Blue Secondary Dressing: Wound #1 Left,Lateral Ischium: ABD pad Dressing Change Frequency: Wound #1 Left,Lateral Ischium: Change dressing every other day. Follow-up Appointments: Wound #1 Left,Lateral Ischium: Return Appointment in 1 week. The wound is making good progress and I will change over to [facility] to be changed every other day with a bordered foam dressing. She will come back and see me next week. Electronic Signature(s) Signed: 07/31/2015 10:14:13 AM By: Christin Fudge MD, FACS Entered By: Christin Fudge on 07/31/2015  10:14:13 [name] ([id number]) -------------------------------------------------------------------------------- SuperBill Details Patient Name: [name]. Date of Service: 07/31/2015 Medical Record Number: [id number] Patient Account Number: [id number] Date of Birth/Sex: Jul 30, 1966 (49 y.o. Female) Treating RN: Cornell Barman Primary Care Physician: Briscoe Deutscher Other Clinician: Referring Physician: Briscoe Deutscher Treating Physician/Extender: Frann Rider in Treatment: 1 Diagnosis Coding ICD-10 Codes Code Description L03.317 Cellulitis of buttock L98.412 Non-pressure chronic ulcer of buttock with fat layer exposed S70.362A Insect bite (nonvenomous), left thigh, initial encounter Facility Procedures CPT4 Code: [id number] Description: [phone] - WOUND CARE VISIT-LEV 2 EST PT Modifier: Quantity: 1 Physician Procedures CPT4 Code: [id number] Description: 81275 - WC PHYS LEVEL 3 - EST PT ICD-10 Description Diagnosis L03.317 Cellulitis of buttock L98.412 Non-pressure chronic ulcer of buttock with fat l S70.362A Insect bite (nonvenomous), left thigh, initial e Modifier: ayer exposed ncounter Quantity: 1 Electronic Signature(s) Signed: 07/31/2015 10:14:27 AM By: Christin Fudge MD, FACS Entered By: Christin Fudge on 07/31/2015 10:14:27

## 2015-08-07 ENCOUNTER — Encounter: Payer: Self-pay | Admitting: Surgery

## 2015-08-09 NOTE — Progress Notes (Signed)
[name] ([id number]) Visit Report for 08/07/2015 Chief Complaint Document Details Patient Name: [name], [name]. Date of Service: 08/07/2015 8:45 AM Medical Record Number: [id number] Patient Account Number: [id number] Date of Birth/Sex: October 04, 1966 (49 y.o. Female) Treating RN: Cornell Barman Primary Care Physician: Briscoe Deutscher Other Clinician: Referring Physician: Briscoe Deutscher Treating Physician/Extender: Frann Rider in Treatment: [name] Information Obtained from: Patient Chief Complaint Patient presents to the wound care center for a consult due non healing wound. She has had a wound on her left upper thigh and buttock region for about a month. Electronic Signature(s) Signed: 08/07/2015 9:24:40 AM By: Christin Fudge MD, FACS Entered By: Christin Fudge on 08/07/2015 09:24:40 [name] ([id number]) -------------------------------------------------------------------------------- HPI Details Patient Name: [name], [name] Date of Service: 08/07/2015 8:45 AM Medical Record Number: [id number] Patient Account Number: [id number] Date of Birth/Sex: [birth year] (49 y.o. Female) Treating RN: Cornell Barman Primary Care Physician: Briscoe Deutscher Other Clinician: Referring Physician: Briscoe Deutscher Treating Physician/Extender: Frann Rider in Treatment: [name] History of Present Illness Location: left lateral upper thigh and hip region Quality: Patient reports experiencing a dull pain to affected area(s). Severity: Patient states wound are getting worse. Duration: Patient has had the wound for < 4 weeks prior to presenting for treatment Timing: Pain in wound is Intermittent (comes and goes Context: The wound occurred when the patient possibly had a insect bite to the left hip and buttock region Modifying Factors: Other treatment(s) tried include: local care was done and recently was put on doxycycline for this. Associated Signs and Symptoms: Wound has purlulent  drainage HPI Description: She has no comorbidities and was initially thought to have a insect bite which gradually caused a large purulent abscess with drainage. Due to lack of insurance she did not seek any medical help but continued over-the-counter remedies for this. She was recently seen by her PCP Dr. Briscoe Deutscher who reviewed her wound and besides putting on doxycycline was sent to the wound center. 08/07/2015 -- her wound is doing fine and she has some issues with the dressing sticking to her skin and difficult to remove it. Other than that she is doing fine. Electronic Signature(s) Signed: 08/07/2015 9:25:11 AM By: Christin Fudge MD, FACS Entered By: Christin Fudge on 08/07/2015 09:25:10 [name] ([id number]) -------------------------------------------------------------------------------- Physical Exam Details Patient Name: [name], [name] Constitutional . Pulse regular. Respirations normal and unlabored. Afebrile. . Eyes Nonicteric. Reactive to light. Ears, Nose, Mouth, and Throat Lips, teeth, and gums WNL.Marland Kitchen Moist mucosa without lesions . Neck supple and nontender. No palpable supraclavicular or cervical adenopathy. Normal sized without goiter. Respiratory WNL. No retractions.. Cardiovascular Pedal Pulses WNL. No clubbing, cyanosis or edema. Lymphatic No adneopathy. No adenopathy. No adenopathy. Musculoskeletal Adexa without tenderness or enlargement.. Digits and nails w/o clubbing, cyanosis, infection, petechiae, ischemia, or inflammatory conditions.. Integumentary (Hair, Skin) No suspicious lesions. No crepitus or fluctuance. No  peri-wound warmth or erythema. No masses.Marland Kitchen Psychiatric Judgement and insight Intact.. No evidence of depression, anxiety, or agitation.. Notes the wound is not looking very clean and has come in as far as size goes. Electronic Signature(s) Signed: 08/07/2015 9:25:42 AM By: Christin Fudge MD, FACS Entered By: Christin Fudge on 08/07/2015 09:25:41 [name] ([id number]) -------------------------------------------------------------------------------- Physician Orders Details Patient  Name: [name], [name] Date of Service: 08/07/2015 8:45 AM Medical Record Number: [id number] Patient Account Number: [id number] Date of Birth/Sex: 05-25-1966 (49 y.o. Female) Treating RN: Cornell Barman Primary Care Physician: Briscoe Deutscher Other Clinician: Referring Physician: Briscoe Deutscher Treating Physician/Extender: Frann Rider in Treatment: [name] Verbal / Phone Orders: Yes Clinician: Cornell Barman Read Back and Verified: Yes Diagnosis Coding Wound Cleansing Wound #1 Left,Lateral Ischium o Clean wound with Normal Saline. Anesthetic Wound #1 Left,Lateral Ischium o Topical Lidocaine 4% cream applied to wound bed prior to debridement Primary Wound Dressing Wound #1 Left,Lateral Ischium o Prisma Ag Secondary Dressing Wound #1 Left,Lateral Ischium o Boardered Foam Dressing Dressing Change Frequency Wound #1 Left,Lateral Ischium o Dressing is to be changed Monday and Thursday. Follow-up Appointments Wound #1 Left,Lateral Ischium o Return Appointment in 1 week. Electronic Signature(s) Signed: 08/07/2015 4:29:43 PM By: Christin Fudge MD, FACS Signed: 08/08/2015 5:26:34 PM By: Gretta Cool RN, BSN, Kim RN, BSN Entered By: Gretta Cool, RN, BSN, Kim on 08/07/2015 78:93:81 [name] ([id number]) -------------------------------------------------------------------------------- Problem List Details Patient Name: [name], PITZ. Date of Service: 08/07/2015 8:45 AM Medical Record Number:  [id number] Patient Account Number: [id number] Date of Birth/Sex: 04-28-1966 (49 y.o. 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 ([id number]) -------------------------------------------------------------------------------- Progress Note Details Patient Name: [name]. Date of Service: 08/07/2015 8:45 AM Medical Record Number: [id number] Patient Account Number: [id number] Date of Birth/Sex: 1966-08-30 (49 y.o. Female) Treating RN: Cornell Barman Primary Care Physician: Briscoe Deutscher Other Clinician: Referring Physician: Briscoe Deutscher Treating Physician/Extender: Frann Rider in Treatment: [name] Subjective Chief Complaint Information obtained from Patient Patient presents to the wound care center for a consult due non healing wound. She has had a wound on her left upper thigh and buttock region for about a month. History of Present Illness (HPI) The following HPI elements were documented for the patient's wound: Location: left lateral upper thigh and hip region Quality: Patient reports experiencing a dull pain to affected area(s). Severity: Patient states wound are getting worse. Duration: Patient has had the wound for < 4 weeks prior to presenting for treatment Timing: Pain in wound is Intermittent (comes and goes Context: The wound occurred when the patient possibly had  a insect bite to the left hip and buttock region Modifying Factors: Other treatment(s) tried include: local care was done and recently was put on doxycycline for this. Associated Signs and Symptoms: Wound has purlulent drainage She has no comorbidities and was initially thought to have a insect bite which gradually caused a large purulent abscess with drainage. Due to lack of insurance she did not seek any medical help but continued over-the-counter remedies for this. She was recently seen by her PCP Dr. Briscoe Deutscher who reviewed her wound and besides putting on doxycycline was sent to the wound center. 08/07/2015 -- her wound is doing fine and she has some issues with the dressing sticking to her skin and difficult to remove it. Other than that she is doing fine. Objective Constitutional Pulse regular. Respirations normal and unlabored. Afebrile. Vitals Time  Taken: 9:05 AM, Height: 65 in, Weight: 168 lbs, BMI: 28, Temperature: 98.3 F, Pulse: 70 bpm, Respiratory Rate: 12 breaths/min, Blood Pressure: 119/66 mmHg. [name], [name] ([id number]) Eyes Nonicteric. Reactive to light. Ears, Nose, Mouth, and Throat Lips, teeth, and gums WNL.Marland Kitchen Moist mucosa without lesions . Neck supple and nontender. No palpable supraclavicular or cervical adenopathy. Normal sized without goiter. Respiratory WNL. No retractions.. Cardiovascular Pedal Pulses WNL. No clubbing, cyanosis or edema. Lymphatic No adneopathy. No adenopathy. No adenopathy. Musculoskeletal Adexa without tenderness or enlargement.. Digits and nails w/o clubbing, cyanosis, infection, petechiae, ischemia, or inflammatory conditions.Marland Kitchen Psychiatric Judgement and insight Intact.. No evidence of depression, anxiety, or agitation.. General Notes: the wound is not looking very clean and has come in as far as size goes. Integumentary (Hair, Skin) No suspicious lesions. No crepitus or fluctuance. No peri-wound warmth or erythema. No  masses.. Wound #1 status is Open. Original cause of wound was Bite. The wound is located on the Left,Lateral Ischium. The wound measures [name].5cm length x 4cm width x 0.1cm depth; 7.854cm^[name] area and 0.785cm^3 volume. The wound is limited to skin breakdown. There is no tunneling or undermining noted. There is a small amount of serous drainage noted. The wound margin is flat and intact. There is large (67-100%) red granulation within the wound bed. There is a small (1-33%) amount of necrotic tissue within the wound bed including Eschar. The periwound skin appearance exhibited: Dry/Scaly, Moist. The periwound skin appearance did not exhibit: Callus, Crepitus, Excoriation, Fluctuance, Friable, Induration, Localized Edema, Rash, Scarring, Maceration, Atrophie Blanche, Cyanosis, Ecchymosis, Hemosiderin Staining, Mottled, Pallor, Rubor, Erythema. Periwound temperature was noted as No Abnormality. The periwound has tenderness on palpation. Assessment Active Problems ICD-10 [name], [name]. 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 She will come back to see [place] next week. Plan Wound Cleansing: Wound #1 Left,Lateral Ischium: Clean wound with Normal Saline. Anesthetic: Wound #1 Left,Lateral Ischium: Topical Lidocaine 4% cream applied to wound bed prior to debridement Primary Wound Dressing: Wound #1 Left,Lateral Ischium: Prisma Ag Secondary Dressing: Wound #1 Left,Lateral Ischium: Boardered Foam Dressing Dressing Change Frequency: Wound #1 Left,Lateral Ischium: Dressing is to be changed Monday and Thursday. Follow-up Appointments: Wound #1 Left,Lateral Ischium: Return Appointment in 1 week. I have recommended Prisma Ag and a  bordered foam dressing and due to the lack of insurance she will change this twice a week. She will come back to see [place] next week. Electronic Signature(s) Signed: 08/07/2015 9:26:55 AM By: Christin Fudge MD, FACS [name], [name] ([id number]) Entered By: Christin Fudge on 08/07/2015 09:26:55 [name], [name] ([id number]) -------------------------------------------------------------------------------- SuperBill Details Patient Name: Janice Gong E. Date of Service: 08/07/2015 Medical Record Number: [id number] Patient Account Number: [id number] Date of Birth/Sex: Jan 31, 1966 (49 y.o. Female) Treating RN: Cornell Barman Primary Care Physician: Briscoe Deutscher Other Clinician: Referring Physician: Briscoe Deutscher Treating Physician/Extender: Frann Rider in Treatment: [name] Diagnosis Coding ICD-10 Codes Code Description L03.317 Cellulitis of buttock L98.412 Non-pressure chronic ulcer of buttock with fat layer exposed S70.362A Insect bite (nonvenomous), left thigh, initial encounter Facility Procedures CPT4 Code: 24401027 Description: 25366 - WOUND CARE VISIT-LEV [name] EST PT Modifier: Quantity: 1 Physician Procedures CPT4 Code: 4403474 Description: 25956 - WC PHYS LEVEL 3 - EST PT ICD-10 Description Diagnosis L03.317 Cellulitis  of buttock L98.412 Non-pressure chronic ulcer of buttock with fat l S70.362A Insect bite (nonvenomous), left thigh, initial e Modifier: ayer exposed ncounter Quantity: 1 Electronic Signature(s) Signed: 08/07/2015 9:27:08 AM By: Christin Fudge MD, FACS Entered By: Christin Fudge on 08/07/2015 09:27:07

## 2015-08-14 ENCOUNTER — Encounter: Payer: Self-pay | Attending: Surgery | Admitting: Surgery

## 2015-08-14 DIAGNOSIS — L03317 Cellulitis of buttock: Secondary | ICD-10-CM | POA: Insufficient documentation

## 2015-08-14 DIAGNOSIS — L98412 Non-pressure chronic ulcer of buttock with fat layer exposed: Secondary | ICD-10-CM | POA: Insufficient documentation

## 2015-08-14 DIAGNOSIS — X58XXXD Exposure to other specified factors, subsequent encounter: Secondary | ICD-10-CM | POA: Insufficient documentation

## 2015-08-14 DIAGNOSIS — S70362D Insect bite (nonvenomous), left thigh, subsequent encounter: Secondary | ICD-10-CM | POA: Insufficient documentation

## 2015-08-15 NOTE — Progress Notes (Signed)
[name], [name] ([id number]) Visit Report for 08/14/2015 Arrival Information Details Patient Name: [name]. Date of Service: 08/14/2015 10:00 AM Medical Record Number: [id number] Patient Account Number: [id number] Date of Birth/Sex: 10/02/1966 (49 y.o. Female) Treating RN: Montey Hora Primary Care Physician: Briscoe Deutscher Other Clinician: Referring Physician: Briscoe Deutscher Treating Physician/Extender: Frann Rider in Treatment: 3 Visit Information History Since Last Visit Added or deleted any medications: No Patient Arrived: Ambulatory Any new allergies or adverse reactions: No Arrival Time: 10:13 Had a fall or experienced change in No Accompanied By: self activities of daily living that may affect Transfer Assistance: None risk of falls: Patient Identification Verified: Yes Signs or symptoms of abuse/neglect since last No Secondary Verification Process Yes visito Completed: Hospitalized since last visit: No Pain Present Now: No Electronic Signature(s) Signed: 08/14/2015 4:58:54 PM By: Montey Hora Entered By: Montey Hora on 08/14/2015 10:13:21 Collar, Krisi Johnette [name] ([id number]) -------------------------------------------------------------------------------- Clinic Level of Care Assessment Details Patient Name: [name]. Date of Service: 08/14/2015 10:00 AM Medical Record Number: [id number] Patient Account Number: [id number] Date of Birth/Sex: [birth year] (49 y.o. Female) Treating RN: Montey Hora Primary Care Physician: Briscoe Deutscher Other Clinician: Referring Physician: Briscoe Deutscher Treating Physician/Extender: Frann Rider in Treatment: 3 Clinic Level of Care Assessment Items TOOL 4 Quantity Score []  - Use when only an EandM is performed on FOLLOW-UP visit 0 ASSESSMENTS - Nursing Assessment / Reassessment X - Reassessment of Co-morbidities (includes updates in patient status) 1 10 X - Reassessment of Adherence to Treatment  Plan 1 5 ASSESSMENTS - Wound and Skin Assessment / Reassessment X - Simple Wound Assessment / Reassessment - one wound 1 5 []  - Complex Wound Assessment / Reassessment - multiple wounds 0 []  - Dermatologic / Skin Assessment (not related to wound area) 0 ASSESSMENTS - Focused Assessment []  - Circumferential Edema Measurements - multi extremities 0 []  - Nutritional Assessment / Counseling / Intervention 0 []  - Lower Extremity Assessment (monofilament, tuning fork, pulses) 0 []  - Peripheral Arterial Disease Assessment (using hand held doppler) 0 ASSESSMENTS - Ostomy and/or Continence Assessment and Care []  - Incontinence Assessment and Management 0 []  - Ostomy Care Assessment and Management (repouching, etc.) 0 PROCESS - Coordination of Care X - Simple Patient / Family Education for ongoing care 1 15 []  - Complex (extensive) Patient / Family Education for ongoing care 0 []  - Staff obtains Programmer, systems, Records, Test Results / Process Orders 0 []  - Staff telephones HHA, Nursing Homes / Clarify orders / etc 0 []  - Routine Transfer to another Facility (non-emergent condition) 0 [name]. ([id number]) []  - Routine Hospital Admission (non-emergent condition) 0 []  - New Admissions / Biomedical engineer / Ordering NPWT, Apligraf, etc. 0 []  - Emergency Hospital Admission (emergent condition) 0 X - Simple Discharge Coordination 1 10 []  - Complex (extensive) Discharge Coordination 0 PROCESS - Special Needs []  - Pediatric / Minor Patient Management 0 []  - Isolation Patient Management 0 []  - Hearing / Language / Visual special needs 0 []  - Assessment of Community assistance (transportation, D/C planning, etc.) 0 []  - Additional assistance / Altered mentation 0 []  - Support Surface(s) Assessment (bed, cushion, seat, etc.) 0 INTERVENTIONS - Wound Cleansing / Measurement X - Simple Wound Cleansing - one wound 1 5 []  - Complex Wound Cleansing - multiple wounds 0 X - Wound Imaging  (photographs - any number of wounds) 1 5 []  - Wound Tracing (instead of photographs) 0 X - Simple Wound Measurement - one wound 1 5 []  -  Complex Wound Measurement - multiple wounds 0 INTERVENTIONS - Wound Dressings X - Small Wound Dressing one or multiple wounds 1 10 []  - Medium Wound Dressing one or multiple wounds 0 []  - Large Wound Dressing one or multiple wounds 0 []  - Application of Medications - topical 0 []  - Application of Medications - injection 0 INTERVENTIONS - Miscellaneous []  - External ear exam 0 [name], [name]. ([id number]) []  - Specimen Collection (cultures, biopsies, blood, body fluids, etc.) 0 []  - Specimen(s) / Culture(s) sent or taken to Lab for analysis 0 []  - Patient Transfer (multiple staff / Harrel Lemon Lift / Similar devices) 0 []  - Simple Staple / Suture removal (25 or less) 0 []  - Complex Staple / Suture removal (26 or more) 0 []  - Hypo / Hyperglycemic Management (close monitor of Blood Glucose) 0 []  - Ankle / Brachial Index (ABI) - do not check if billed separately 0 X - Vital Signs 1 5 Has the patient been seen at the hospital within the last three years: Yes Total Score: 75 Level Of Care: New/Established - Level 2 Electronic Signature(s) Signed: 08/14/2015 4:58:54 PM By: Montey Hora Entered By: Montey Hora on 08/14/2015 10:37:42 [name], [name] ([id number]) -------------------------------------------------------------------------------- Encounter Discharge Information Details Patient Name: [name]. Date of Service: 08/14/2015 10:00 AM Medical Record Number: [id number] Patient Account Number: [id number] Date of Birth/Sex: 1966/05/23 (49 y.o. Female) Treating RN: Montey Hora Primary Care Physician: Briscoe Deutscher Other Clinician: Referring Physician: Briscoe Deutscher Treating Physician/Extender: Frann Rider in Treatment: 3 Encounter Discharge Information Items Discharge Pain Level: 0 Discharge Condition: Stable Ambulatory  Status: Ambulatory Discharge Destination: Home Transportation: Private Auto Accompanied By: self Schedule Follow-up Appointment: Yes Medication Reconciliation completed and provided to Patient/Care No Noreen Mackintosh: Provided on Clinical Summary of Care: 08/14/2015 Form Type Recipient Paper Patient [facility] Electronic Signature(s) Signed: 08/14/2015 10:46:42 AM By: Ruthine Dose Entered By: Ruthine Dose on 08/14/2015 10:46:42 Wittler, Kahmya [name]. ([id number]) -------------------------------------------------------------------------------- Multi Wound Chart Details Patient Name: Janice Gong [name]. Date of Service: 08/14/2015 10:00 AM Medical Record Number: [id number] Patient Account Number: [id number] Date of Birth/Sex: 1965/12/26 (49 y.o. Female) Treating RN: Montey Hora Primary Care Physician: Briscoe Deutscher Other Clinician: Referring Physician: Briscoe Deutscher Treating Physician/Extender: Frann Rider in Treatment: 3 Vital Signs Height(in): 65 Pulse(bpm): 76 Weight(lbs): 168 Blood Pressure 131/64 (mmHg): Body Mass Index(BMI): 28 Temperature(F): 98.4 Respiratory Rate 14 (breaths/min): Photos: [1:No Photos] [N/A:N/A] Wound Location: [1:Left Ischium - Lateral] [N/A:N/A] Wounding Event: [1:Bite] [N/A:N/A] Primary Etiology: [1:Cellulitis] [N/A:N/A] Date Acquired: [1:06/18/2015] [N/A:N/A] Weeks of Treatment: [1:3] [N/A:N/A] Wound Status: [1:Open] [N/A:N/A] Classification: [1:Full Thickness Without Exposed Support Structures] [N/A:N/A] Exudate Amount: [1:Small] [N/A:N/A] Exudate Type: [1:Serous] [N/A:N/A] Exudate Color: [1:amber] [N/A:N/A] Wound Margin: [1:Flat and Intact] [N/A:N/A] Granulation Amount: [1:Large (67-100%)] [N/A:N/A] Granulation Quality: [1:Red] [N/A:N/A] Necrotic Amount: [1:Small (1-33%)] [N/A:N/A] Necrotic Tissue: [1:Eschar] [N/A:N/A] Exposed Structures: [1:Fascia: No Fat: No Tendon: No Muscle: No Joint: No Bone: No Limited to Skin Breakdown]  [N/A:N/A] Epithelialization: [1:Small (1-33%)] [N/A:N/A] Periwound Skin Texture: Edema: No [1:Excoriation: No Induration: No] [N/A:N/A] Callus: No Crepitus: No Fluctuance: No Friable: No Rash: No Scarring: No Periwound Skin Moist: Yes N/A N/A Moisture: Dry/Scaly: Yes Maceration: No Periwound Skin Color: Atrophie Blanche: No N/A N/A Cyanosis: No Ecchymosis: No Erythema: No Hemosiderin Staining: No Mottled: No Pallor: No Rubor: No Temperature: No Abnormality N/A N/A Tenderness on Yes N/A N/A Palpation: Wound Preparation: Ulcer Cleansing: N/A N/A Rinsed/Irrigated with Saline Topical Anesthetic Applied: Other: lidocaine 4% Treatment Notes Electronic Signature(s) Signed: 08/14/2015 4:58:54 PM By: Montey Hora  Entered By: Montey Hora on 08/14/2015 10:36:23 [name] ([id number]) -------------------------------------------------------------------------------- [place] Details Patient Name: [name], [name]. Date of Service: 08/14/2015 10:00 AM Medical Record Number: [id number] Patient Account Number: [id number] Date of Birth/Sex: Dec 21, 1965 (49 y.o. Female) Treating RN: Montey Hora Primary Care Physician: Briscoe Deutscher Other Clinician: Referring Physician: Briscoe Deutscher Treating Physician/Extender: Frann Rider in Treatment: 3 Active Inactive Necrotic Tissue Nursing Diagnoses: Impaired tissue integrity related to necrotic/devitalized tissue Goals: Necrotic/devitalized tissue will be minimized in the wound bed Date Initiated: 07/24/2015 Goal Status: Active Patient/caregiver will verbalize understanding of reason and process for debridement of necrotic tissue Date Initiated: 07/24/2015 Goal Status: Active Interventions: Assess patient pain level pre-, during and post procedure and prior to discharge Provide education on necrotic tissue and debridement process Treatment Activities: Apply topical anesthetic as ordered :  08/14/2015 Notes: Orientation to the Wound Care Program Nursing Diagnoses: Knowledge deficit related to the wound healing center program Goals: Patient/caregiver will verbalize understanding of the [place] Program Date Initiated: 07/24/2015 Goal Status: Active Interventions: Provide education on orientation to the wound center Notes: [name], EUTSLER. ([id number]) Wound/Skin Impairment Nursing Diagnoses: Impaired tissue integrity Goals: Ulcer/skin breakdown will have a volume reduction of 30% by week 4 Date Initiated: 07/24/2015 Goal Status: Active Ulcer/skin breakdown will have a volume reduction of 50% by week 8 Date Initiated: 07/24/2015 Goal Status: Active Ulcer/skin breakdown will have a volume reduction of 80% by week 12 Date Initiated: 07/24/2015 Goal Status: Active Ulcer/skin breakdown will heal within 14 weeks Date Initiated: 07/24/2015 Goal Status: Active Interventions: Assess patient/caregiver ability to obtain necessary supplies Assess ulceration(s) every visit Notes: Electronic Signature(s) Signed: 08/14/2015 4:58:54 PM By: Montey Hora Entered By: Montey Hora on 08/14/2015 10:35:53 [name], [name] ([id number]) -------------------------------------------------------------------------------- Patient/Caregiver Education Details Patient Name: [name]. Date of Service: 08/14/2015 10:00 AM Medical Record Number: [id number] Patient Account Number: [id number] Date of Birth/Gender: 02-28-1966 (49 y.o. Female) Treating RN: Montey Hora Primary Care Physician: Briscoe Deutscher Other Clinician: Referring Physician: Briscoe Deutscher Treating Physician/Extender: Frann Rider in Treatment: 3 Education Assessment Education Provided To: Patient Education Topics Provided Wound/Skin Impairment: Handouts: Other: wound care as ordered Methods: Demonstration, Explain/Verbal Responses: State content correctly Electronic Signature(s) Signed:  08/14/2015 10:31:14 AM By: Montey Hora Entered By: Montey Hora on 08/14/2015 10:31:14 [name], [name]. ([id number]) -------------------------------------------------------------------------------- Wound Assessment Details Patient Name: [name], [name]. Date of Service: 08/14/2015 10:00 AM Medical Record Number: [id number] Patient Account Number: [id number] Date of Birth/Sex: December 28, 1965 (49 y.o. Female) Treating RN: Montey Hora Primary Care Physician: Briscoe Deutscher Other Clinician: Referring Physician: Briscoe Deutscher Treating Physician/Extender: Frann Rider in Treatment: 3 Wound Status Wound Number: 1 Primary Etiology: Cellulitis Wound Location: Left, Lateral Ischium Wound Status: Open Wounding Event: Bite Date Acquired: 06/18/2015 Weeks Of Treatment: 3 Clustered Wound: No Photos Photo Uploaded By: Montey Hora on 08/14/2015 10:55:12 Wound Measurements Length: (cm) 2.2 Width: (cm) 3.7 Depth: (cm) 0.1 Area: (cm) 6.393 Volume: (cm) 0.639 % Reduction in Area: 59.2% % Reduction in Volume: 79.6% Epithelialization: Small (1-33%) Tunneling: No Undermining: No Wound Description Full Thickness Without Exposed Classification: Support Structures Wound Margin: Flat and Intact Exudate Small Amount: Exudate Type: Serous Exudate Color: amber Foul Odor After Cleansing: No Wound Bed Granulation Amount: Large (67-100%) Exposed Structure Granulation Quality: Red Fascia Exposed: No Necrotic Amount: Small (1-33%) Fat Layer Exposed: No [name], [name]. ([id number]) Necrotic Quality: Eschar Tendon Exposed: No Muscle Exposed: No Joint Exposed: No Bone Exposed: No Limited to Skin Breakdown Periwound Skin  Texture Texture Color No Abnormalities Noted: No No Abnormalities Noted: No Callus: No Atrophie Blanche: No Crepitus: No Cyanosis: No Excoriation: No Ecchymosis: No Fluctuance: No Erythema: No Friable: No Hemosiderin Staining: No Induration:  No Mottled: No Localized Edema: No Pallor: No Rash: No Rubor: No Scarring: No Temperature / Pain Moisture Temperature: No Abnormality No Abnormalities Noted: No Tenderness on Palpation: Yes Dry / Scaly: Yes Maceration: No Moist: Yes Wound Preparation Ulcer Cleansing: Rinsed/Irrigated with Saline Topical Anesthetic Applied: Other: lidocaine 4%, Treatment Notes Wound #1 (Left, Lateral Ischium) 1. Cleansed with: Clean wound with Normal Saline 2. Anesthetic Topical Lidocaine 4% cream to wound bed prior to debridement 4. Dressing Applied: Prisma Ag 5. Secondary Dressing Applied ABD Pad 7. Secured with Recruitment consultant) Signed: 08/14/2015 4:58:54 PM By: Montey Hora Entered By: Montey Hora on 08/14/2015 10:41:58 [name], Janice EMarland Kitchen ([id number]) -------------------------------------------------------------------------------- [place] Details Patient Name: [name]. Date of Service: 08/14/2015 10:00 AM Medical Record Number: [id number] Patient Account Number: [id number] Date of Birth/Sex: [birth year] (49 y.o. Female) Treating RN: Montey Hora Primary Care Physician: Briscoe Deutscher Other Clinician: Referring Physician: Briscoe Deutscher Treating Physician/Extender: Frann Rider in Treatment: 3 Vital Signs Time Taken: 10:13 Temperature (F): 98.4 Height (in): 65 Pulse (bpm): 76 Weight (lbs): 168 Respiratory Rate (breaths/min): 14 Body Mass Index (BMI): 28 Blood Pressure (mmHg): 131/64 Reference Range: 80 - 120 mg / dl Electronic Signature(s) Signed: 08/14/2015 4:58:54 PM By: Montey Hora Entered By: Montey Hora on 08/14/2015 10:14:55

## 2015-08-15 NOTE — Progress Notes (Signed)
Janice, [name] ([id number]) Visit Report for 07/31/2015 Arrival Information Details Patient Name: [name], [name]. Date of Service: 07/31/2015 9:30 AM Medical Record Number: [id number] Patient Account Number: [id number] Date of Birth/Sex: 09/16/1966 (49 y.o. Female) Treating RN: Junious Dresser Primary Care Physician: Briscoe Deutscher Other Clinician: Referring Physician: Briscoe Deutscher Treating Physician/Extender: Frann Rider in Treatment: 1 Visit Information History Since Last Visit Added or deleted any medications: No Patient Arrived: Ambulatory Any new allergies or adverse reactions: No Arrival Time: 09:38 Had a fall or experienced change in No Accompanied By: self activities of daily living that may affect Transfer Assistance: None risk of falls: Signs or symptoms of abuse/neglect since last No visito Hospitalized since last visit: No Has Dressing in Place as Prescribed: No Pain Present Now: No Electronic Signature(s) Signed: 08/14/2015 4:32:11 PM By: Junious Dresser RN Entered By: Junious Dresser on 07/31/2015 09:39:04 Kretzschmar, Aairah E. ([id number]) -------------------------------------------------------------------------------- Clinic Level of Care Assessment Details Patient Name: [name]. Date of Service: 07/31/2015 9:30 AM Medical Record Number: [id number] Patient Account Number: [id number] Date of Birth/Sex: August 14, 1966 (49 y.o. Female) Treating RN: Cornell Barman Primary Care Physician: Briscoe Deutscher Other Clinician: Referring Physician: Briscoe Deutscher Treating Physician/Extender: Frann Rider in Treatment: 1 Clinic Level of Care Assessment Items TOOL 4 Quantity Score []  - Use when only an EandM is performed on FOLLOW-UP visit 0 ASSESSMENTS - Nursing Assessment / Reassessment []  - Reassessment of Co-morbidities (includes updates in patient status) 0 X - Reassessment of Adherence to Treatment Plan 1 5 ASSESSMENTS - Wound and Skin Assessment /  Reassessment X - Simple Wound Assessment / Reassessment - one wound 1 5 []  - Complex Wound Assessment / Reassessment - multiple wounds 0 []  - Dermatologic / Skin Assessment (not related to wound area) 0 ASSESSMENTS - Focused Assessment []  - Circumferential Edema Measurements - multi extremities 0 []  - Nutritional Assessment / Counseling / Intervention 0 []  - Lower Extremity Assessment (monofilament, tuning fork, pulses) 0 []  - Peripheral Arterial Disease Assessment (using hand held doppler) 0 ASSESSMENTS - Ostomy and/or Continence Assessment and Care []  - Incontinence Assessment and Management 0 []  - Ostomy Care Assessment and Management (repouching, etc.) 0 PROCESS - Coordination of Care X - Simple Patient / Family Education for ongoing care 1 15 []  - Complex (extensive) Patient / Family Education for ongoing care 0 []  - Staff obtains Programmer, systems, Records, Test Results / Process Orders 0 []  - Staff telephones HHA, Nursing Homes / Clarify orders / etc 0 []  - Routine Transfer to another Facility (non-emergent condition) 0 Gronau, Chandelle E. ([id number]) []  - Routine Hospital Admission (non-emergent condition) 0 []  - New Admissions / Biomedical engineer / Ordering NPWT, Apligraf, etc. 0 []  - Emergency Hospital Admission (emergent condition) 0 X - Simple Discharge Coordination 1 10 []  - Complex (extensive) Discharge Coordination 0 PROCESS - Special Needs []  - Pediatric / Minor Patient Management 0 []  - Isolation Patient Management 0 []  - Hearing / Language / Visual special needs 0 []  - Assessment of Community assistance (transportation, D/C planning, etc.) 0 []  - Additional assistance / Altered mentation 0 []  - Support Surface(s) Assessment (bed, cushion, seat, etc.) 0 INTERVENTIONS - Wound Cleansing / Measurement X - Simple Wound Cleansing - one wound 1 5 []  - Complex Wound Cleansing - multiple wounds 0 X - Wound Imaging (photographs - any number of wounds) 1 5 []  - Wound Tracing  (instead of photographs) 0 X - Simple Wound Measurement - one wound 1 5 []  - Complex  Wound Measurement - multiple wounds 0 INTERVENTIONS - Wound Dressings X - Small Wound Dressing one or multiple wounds 1 10 []  - Medium Wound Dressing one or multiple wounds 0 []  - Large Wound Dressing one or multiple wounds 0 []  - Application of Medications - topical 0 []  - Application of Medications - injection 0 INTERVENTIONS - Miscellaneous []  - External ear exam 0 Capes, Luella E. ([id number]) []  - Specimen Collection (cultures, biopsies, blood, body fluids, etc.) 0 []  - Specimen(s) / Culture(s) sent or taken to Lab for analysis 0 []  - Patient Transfer (multiple staff / Harrel Lemon Lift / Similar devices) 0 []  - Simple Staple / Suture removal (25 or less) 0 []  - Complex Staple / Suture removal (26 or more) 0 []  - Hypo / Hyperglycemic Management (close monitor of Blood Glucose) 0 []  - Ankle / Brachial Index (ABI) - do not check if billed separately 0 X - Vital Signs 1 5 Has the patient been seen at the hospital within the last three years: Yes Total Score: 65 Level Of Care: New/Established - Level 2 Electronic Signature(s) Signed: 07/31/2015 4:49:24 PM By: Gretta Cool, RN, BSN, Kim RN, BSN Entered By: Gretta Cool, RN, BSN, Kim on 07/31/2015 10:05:22 [name] ([id number]) -------------------------------------------------------------------------------- Encounter Discharge Information Details Patient Name: [name]. Date of Service: 07/31/2015 9:30 AM Medical Record Number: [id number] Patient Account Number: [id number] Date of Birth/Sex: 11-07-1966 (49 y.o. Female) Treating RN: Junious Dresser Primary Care Physician: Briscoe Deutscher Other Clinician: Referring Physician: Briscoe Deutscher Treating Physician/Extender: Frann Rider in Treatment: 1 Encounter Discharge Information Items Discharge Condition: Stable Ambulatory Status: Ambulatory Discharge Destination:  Home Private Transportation: Auto Accompanied By: seld Schedule Follow-up Appointment: Yes Medication Reconciliation completed and No provided to Patient/Care Provider: Clinical Summary of Care: Electronic Signature(s) Signed: 08/14/2015 4:32:11 PM By: Junious Dresser RN Entered By: Junious Dresser on 07/31/2015 10:17:24 Kesinger, Haeley E. ([id number]) -------------------------------------------------------------------------------- Multi Wound Chart Details Patient Name: [name]. Date of Service: 07/31/2015 9:30 AM Medical Record Number: [id number] Patient Account Number: [id number] Date of Birth/Sex: 1966-08-26 (49 y.o. Female) Treating RN: Cornell Barman Primary Care Physician: Briscoe Deutscher Other Clinician: Referring Physician: Briscoe Deutscher Treating Physician/Extender: Frann Rider in Treatment: 1 Vital Signs Height(in): 65 Pulse(bpm): 70 Weight(lbs): 168 Blood Pressure 122/70 (mmHg): Body Mass Index(BMI): 28 Temperature(F): 98.1 Respiratory Rate 16 (breaths/min): Photos: [1:No Photos] [N/A:N/A] Wound Location: [1:Left Ischium - Lateral] [N/A:N/A] Wounding Event: [1:Bite] [N/A:N/A] Primary Etiology: [1:Cellulitis] [N/A:N/A] Date Acquired: [1:06/18/2015] [N/A:N/A] Weeks of Treatment: [1:1] [N/A:N/A] Wound Status: [1:Open] [N/A:N/A] Measurements L x W x D 3.1x5x0.2 [N/A:N/A] (cm) Area (cm) : [1:12.174] [N/A:N/A] Volume (cm) : [1:2.435] [N/A:N/A] % Reduction in Area: [1:22.30%] [N/A:N/A] % Reduction in Volume: 22.30% [N/A:N/A] Classification: [1:Full Thickness Without Exposed Support Structures] [N/A:N/A] Exudate Amount: [1:Small] [N/A:N/A] Exudate Type: [1:Serous] [N/A:N/A] Exudate Color: [1:amber] [N/A:N/A] Wound Margin: [1:Flat and Intact] [N/A:N/A] Granulation Amount: [1:Large (67-100%)] [N/A:N/A] Granulation Quality: [1:Red] [N/A:N/A] Necrotic Amount: [1:Small (1-33%)] [N/A:N/A] Necrotic Tissue: [1:Eschar] [N/A:N/A] Exposed  Structures: [1:Fascia: No Fat: No Tendon: No Muscle: No Joint: No Bone: No] [N/A:N/A] Limited to Skin Breakdown Epithelialization: Small (1-33%) N/A N/A Periwound Skin Texture: Edema: No N/A N/A Excoriation: No Induration: No Callus: No Crepitus: No Fluctuance: No Friable: No Rash: No Scarring: No Periwound Skin Moist: Yes N/A N/A Moisture: Maceration: No Dry/Scaly: No Periwound Skin Color: Atrophie Blanche: No N/A N/A Cyanosis: No Ecchymosis: No Erythema: No Hemosiderin Staining: No Mottled: No Pallor: No Rubor: No Temperature: No Abnormality N/A N/A Tenderness on Yes N/A N/A Palpation:  Wound Preparation: Ulcer Cleansing: N/A N/A Rinsed/Irrigated with Saline Topical Anesthetic Applied: Other: lidocaine 4% Treatment Notes Electronic Signature(s) Signed: 07/31/2015 4:49:24 PM By: Gretta Cool, RN, BSN, Kim RN, BSN Entered By: Gretta Cool, RN, BSN, Kim on 07/31/2015 10:02:42 [name] ([id number]) -------------------------------------------------------------------------------- [facility] Details Patient Name: ELLANA, KAWA. Date of Service: 07/31/2015 9:30 AM Medical Record Number: [id number] Patient Account Number: [id number] Date of Birth/Sex: 1966-08-22 (49 y.o. Female) Treating RN: Cornell Barman Primary Care Physician: Briscoe Deutscher Other Clinician: Referring Physician: Briscoe Deutscher Treating Physician/Extender: Frann Rider in Treatment: 1 Active Inactive Necrotic Tissue Nursing Diagnoses: Impaired tissue integrity related to necrotic/devitalized tissue Goals: Necrotic/devitalized tissue will be minimized in the wound bed Date Initiated: 07/24/2015 Goal Status: Active Patient/caregiver will verbalize understanding of reason and process for debridement of necrotic tissue Date Initiated: 07/24/2015 Goal Status: Active Interventions: Assess patient pain level pre-, during and post procedure and prior to discharge Provide education on  necrotic tissue and debridement process Treatment Activities: Apply topical anesthetic as ordered : 07/31/2015 Notes: Orientation to the Wound Care Program Nursing Diagnoses: Knowledge deficit related to the wound healing center program Goals: Patient/caregiver will verbalize understanding of the [facility] Date Initiated: 07/24/2015 Goal Status: Active Interventions: Provide education on orientation to the wound center Notes: DEDEE, LISS. ([id number]) Wound/Skin Impairment Nursing Diagnoses: Impaired tissue integrity Goals: Ulcer/skin breakdown will have a volume reduction of 30% by week 4 Date Initiated: 07/24/2015 Goal Status: Active Ulcer/skin breakdown will have a volume reduction of 50% by week 8 Date Initiated: 07/24/2015 Goal Status: Active Ulcer/skin breakdown will have a volume reduction of 80% by week 12 Date Initiated: 07/24/2015 Goal Status: Active Ulcer/skin breakdown will heal within 14 weeks Date Initiated: 07/24/2015 Goal Status: Active Interventions: Assess patient/caregiver ability to obtain necessary supplies Assess ulceration(s) every visit Notes: Electronic Signature(s) Signed: 07/31/2015 4:49:24 PM By: Gretta Cool, RN, BSN, Kim RN, BSN Entered By: Gretta Cool, RN, BSN, Kim on 07/31/2015 10:02:35 [name] ([id number]) -------------------------------------------------------------------------------- Patient/Caregiver Education Details Patient Name: [name]. Date of Service: 07/31/2015 9:30 AM Medical Record Number: [id number] Patient Account Number: [id number] Date of Birth/Gender: [birth year] (49 y.o. Female) Treating RN: Junious Dresser Primary Care Physician: Briscoe Deutscher Other Clinician: Referring Physician: Briscoe Deutscher Treating Physician/Extender: Frann Rider in Treatment: 1 Education Assessment Education Provided To: Patient Education Topics Provided Malignant/Atypical Wounds: Methods:  Explain/Verbal Responses: State content correctly Pain: Methods: Explain/Verbal Responses: State content correctly Wound Debridement: Methods: Explain/Verbal Responses: State content correctly Wound/Skin Impairment: Methods: Explain/Verbal Responses: State content correctly Electronic Signature(s) Signed: 08/14/2015 4:32:11 PM By: Junious Dresser RN Entered By: Junious Dresser on 07/31/2015 10:17:45 Kafer, Ayako E. ([id number]) -------------------------------------------------------------------------------- Wound Assessment Details Patient Name: [name] Date of Service: 07/31/2015 9:30 AM Medical Record Number: [id number] Patient Account Number: [id number] Date of Birth/Sex: [birth year] (49 y.o. Female) Treating RN: Junious Dresser Primary Care Physician: Briscoe Deutscher Other Clinician: Referring Physician: Briscoe Deutscher Treating Physician/Extender: Frann Rider in Treatment: 1 Wound Status Wound Number: 1 Primary Etiology: Cellulitis Wound Location: Left Ischium - Lateral Wound Status: Open Wounding Event: Bite Date Acquired: 06/18/2015 Weeks Of Treatment: 1 Clustered Wound: No Photos Photo Uploaded By: Gretta Cool, RN, BSN, Kim on 07/31/2015 15:09:54 Wound Measurements Length: (cm) 3.1 Width: (cm) 5 Depth: (cm) 0.2 Area: (cm) 12.174 Volume: (cm) 2.435 % Reduction in Area: 22.3% % Reduction in Volume: 22.3% Epithelialization: Small (1-33%) Tunneling: No Undermining: No Wound Description Full Thickness Without Exposed Classification: Support Structures Wound Margin: Flat and Intact Exudate Small Amount:  Exudate Type: Serous Exudate Color: amber Foul Odor After Cleansing: No Wound Bed Granulation Amount: Large (67-100%) Exposed Structure Granulation Quality: Red Fascia Exposed: No Necrotic Amount: Small (1-33%) Fat Layer Exposed: No Prochnow, Lolah E. ([id number]) Necrotic Quality: Eschar Tendon Exposed: No Muscle Exposed: No Joint Exposed:  No Bone Exposed: No Limited to Skin Breakdown Periwound Skin Texture Texture Color No Abnormalities Noted: No No Abnormalities Noted: No Callus: No Atrophie Blanche: No Crepitus: No Cyanosis: No Excoriation: No Ecchymosis: No Fluctuance: No Erythema: No Friable: No Hemosiderin Staining: No Induration: No Mottled: No Localized Edema: No Pallor: No Rash: No Rubor: No Scarring: No Temperature / Pain Moisture Temperature: No Abnormality No Abnormalities Noted: No Tenderness on Palpation: Yes Dry / Scaly: No Maceration: No Moist: Yes Wound Preparation Ulcer Cleansing: Rinsed/Irrigated with Saline Topical Anesthetic Applied: Other: lidocaine 4%, Electronic Signature(s) Signed: 08/14/2015 4:32:11 PM By: Junious Dresser RN Entered By: Junious Dresser on 07/31/2015 09:47:56 Linney, Edwinna Areola ([id number]) -------------------------------------------------------------------------------- Vitals Details Patient Name: [name]. Date of Service: 07/31/2015 9:30 AM Medical Record Number: [id number] Patient Account Number: [id number] Date of Birth/Sex: 12/29/1965 (49 y.o. Female) Treating RN: Junious Dresser Primary Care Physician: Briscoe Deutscher Other Clinician: Referring Physician: Briscoe Deutscher Treating Physician/Extender: Frann Rider in Treatment: 1 Vital Signs Time Taken: 09:39 Temperature (F): 98.1 Height (in): 65 Pulse (bpm): 70 Weight (lbs): 168 Respiratory Rate (breaths/min): 16 Body Mass Index (BMI): 28 Blood Pressure (mmHg): 122/70 Reference Range: 80 - 120 mg / dl Electronic Signature(s) Signed: 08/14/2015 4:32:11 PM By: Junious Dresser RN Entered By: Junious Dresser on 07/31/2015 09:40:20

## 2015-08-15 NOTE — Progress Notes (Signed)
LY, WASS ([id number]) Visit Report for 08/07/2015 Arrival Information Details Patient Name: [name], [name]. Date of Service: 08/07/2015 8:45 AM Medical Record Number: [id number] Patient Account Number: [id number] Date of Birth/Sex: 04/10/1966 (49 y.o. Female) Treating RN: Junious Dresser Primary Care Physician: Briscoe Deutscher Other Clinician: Referring Physician: Briscoe Deutscher Treating Physician/Extender: Frann Rider in Treatment: 2 Visit Information History Since Last Visit Added or deleted any medications: No Patient Arrived: Ambulatory Any new allergies or adverse reactions: No Arrival Time: 09:01 Had a fall or experienced change in No Accompanied By: self activities of daily living that may affect Transfer Assistance: None risk of falls: Patient Identification Verified: Yes Signs or symptoms of abuse/neglect since last No Secondary Verification Process Yes visito Completed: Hospitalized since last visit: No Has Dressing in Place as Prescribed: Yes Pain Present Now: Yes Electronic Signature(s) Signed: 08/14/2015 4:32:11 PM By: Junious Dresser RN Entered By: Junious Dresser on 08/07/2015 09:05:17 [name], [name] ([id number]) -------------------------------------------------------------------------------- Clinic Level of Care Assessment Details Patient Name: [name]. Date of Service: 08/07/2015 8:45 AM Medical Record Number: [id number] Patient Account Number: [id number] Date of Birth/Sex: Sep 01, 1966 (49 y.o. Female) Treating RN: Cornell Barman Primary Care Physician: Briscoe Deutscher Other Clinician: Referring Physician: Briscoe Deutscher Treating Physician/Extender: Frann Rider in Treatment: 2 Clinic Level of Care Assessment Items TOOL 4 Quantity Score []  - Use when only an EandM is performed on FOLLOW-UP visit 0 ASSESSMENTS - Nursing Assessment / Reassessment X - Reassessment of Co-morbidities (includes updates in patient status) 1 10 X -  Reassessment of Adherence to Treatment Plan 1 5 ASSESSMENTS - Janice and Skin Assessment / Reassessment X - Simple Janice Assessment / Reassessment - one Janice 1 5 []  - Complex Janice Assessment / Reassessment - multiple wounds 0 []  - Dermatologic / Skin Assessment (not related to Janice area) 0 ASSESSMENTS - Focused Assessment []  - Circumferential Edema Measurements - multi extremities 0 []  - Nutritional Assessment / Counseling / Intervention 0 []  - Lower Extremity Assessment (monofilament, tuning fork, pulses) 0 []  - Peripheral Arterial Disease Assessment (using hand held doppler) 0 ASSESSMENTS - Ostomy and/or Continence Assessment and Care []  - Incontinence Assessment and Management 0 []  - Ostomy Care Assessment and Management (repouching, etc.) 0 PROCESS - Coordination of Care X - Simple Patient / Family Education for ongoing care 1 15 []  - Complex (extensive) Patient / Family Education for ongoing care 0 []  - Staff obtains Programmer, systems, Records, Test Results / Process Orders 0 []  - Staff telephones HHA, Nursing Homes / Clarify orders / etc 0 []  - Routine Transfer to another Facility (non-emergent condition) 0 [name], [name] ([id number]) []  - Routine Hospital Admission (non-emergent condition) 0 []  - New Admissions / Biomedical engineer / Ordering NPWT, Apligraf, etc. 0 []  - Emergency Hospital Admission (emergent condition) 0 X - Simple Discharge Coordination 1 10 []  - Complex (extensive) Discharge Coordination 0 PROCESS - Special Needs []  - Pediatric / Minor Patient Management 0 []  - Isolation Patient Management 0 []  - Hearing / Language / Visual special needs 0 []  - Assessment of Community assistance (transportation, D/C planning, etc.) 0 []  - Additional assistance / Altered mentation 0 []  - Support Surface(s) Assessment (bed, cushion, seat, etc.) 0 INTERVENTIONS - Janice Cleansing / Measurement X - Simple Janice Cleansing - one Janice 1 5 []  - Complex Janice Cleansing - multiple  wounds 0 X - Janice Imaging (photographs - any number of wounds) 1 5 []  - Janice Tracing (instead of photographs) 0 X - Simple  Janice Measurement - one Janice 1 5 []  - Complex Janice Measurement - multiple wounds 0 INTERVENTIONS - Janice Dressings X - Small Janice Dressing one or multiple wounds 1 10 []  - Medium Janice Dressing one or multiple wounds 0 []  - Large Janice Dressing one or multiple wounds 0 []  - Application of Medications - topical 0 []  - Application of Medications - injection 0 INTERVENTIONS - Miscellaneous []  - External ear exam 0 Madero, Haydyn E. ([id number]) []  - Specimen Collection (cultures, biopsies, blood, body fluids, etc.) 0 []  - Specimen(s) / Culture(s) sent or taken to Lab for analysis 0 []  - Patient Transfer (multiple staff / Harrel Lemon Lift / Similar devices) 0 []  - Simple Staple / Suture removal (25 or less) 0 []  - Complex Staple / Suture removal (26 or more) 0 []  - Hypo / Hyperglycemic Management (close monitor of Blood Glucose) 0 []  - Ankle / Brachial Index (ABI) - do not check if billed separately 0 X - Vital Signs 1 5 Has the patient been seen at the hospital within the last three years: Yes Total Score: 75 Level Of Care: New/Established - Level 2 Electronic Signature(s) Signed: 08/08/2015 5:26:34 PM By: Gretta Cool, RN, BSN, Kim RN, BSN Entered By: Gretta Cool, RN, BSN, Kim on 08/07/2015 09:24:32 [name] ([id number]) -------------------------------------------------------------------------------- Encounter Discharge Information Details Patient Name: [name], [name] Date of Service: 08/07/2015 8:45 AM Medical Record Number: [id number] Patient Account Number: [id number] Date of Birth/Sex: 16-Dec-1965 (49 y.o. Female) Treating RN: Cornell Barman Primary Care Physician: Briscoe Deutscher Other Clinician: Referring Physician: Briscoe Deutscher Treating Physician/Extender: Frann Rider in Treatment: 2 Encounter Discharge Information Items Discharge Condition:  Stable Ambulatory Status: Ambulatory Discharge Destination: Home Transportation: Private Auto Accompanied By: husband Schedule Follow-up Appointment: Yes Medication Reconciliation completed and provided to Patient/Care No Provider: Provided on Clinical Summary of Care: 08/07/2015 Form Type Recipient Paper Patient [facility] Electronic Signature(s) Signed: 08/14/2015 4:32:11 PM By: Junious Dresser RN Previous Signature: 08/07/2015 9:30:37 AM Version By: Ruthine Dose Entered By: Junious Dresser on 08/07/2015 09:35:44 Simkins, Arine E. ([id number]) -------------------------------------------------------------------------------- Multi Janice Chart Details Patient Name: Janice Gong E. Date of Service: 08/07/2015 8:45 AM Medical Record Number: [id number] Patient Account Number: [id number] Date of Birth/Sex: 1966/05/04 (49 y.o. Female) Treating RN: Cornell Barman Primary Care Physician: Briscoe Deutscher Other Clinician: Referring Physician: Briscoe Deutscher Treating Physician/Extender: Frann Rider in Treatment: 2 Vital Signs Height(in): 65 Pulse(bpm): 70 Weight(lbs): 168 Blood Pressure 119/66 (mmHg): Body Mass Index(BMI): 28 Temperature(F): 98.3 Respiratory Rate 12 (breaths/min): Photos: [1:No Photos] [N/A:N/A] Janice Location: [1:Left Ischium - Lateral] [N/A:N/A] Wounding Event: [1:Bite] [N/A:N/A] Primary Etiology: [1:Cellulitis] [N/A:N/A] Date Acquired: [1:06/18/2015] [N/A:N/A] Weeks of Treatment: [1:2] [N/A:N/A] Janice Status: [1:Open] [N/A:N/A] Measurements L x W x D 2.5x4x0.1 [N/A:N/A] (cm) Area (cm) : [1:7.854] [N/A:N/A] Volume (cm) : [1:0.785] [N/A:N/A] % Reduction in Area: [1:49.90%] [N/A:N/A] % Reduction in Volume: 75.00% [N/A:N/A] Classification: [1:Full Thickness Without Exposed Support Structures] [N/A:N/A] Exudate Amount: [1:Small] [N/A:N/A] Exudate Type: [1:Serous] [N/A:N/A] Exudate Color: [1:amber] [N/A:N/A] Janice Margin: [1:Flat and Intact]  [N/A:N/A] Granulation Amount: [1:Large (67-100%)] [N/A:N/A] Granulation Quality: [1:Red] [N/A:N/A] Necrotic Amount: [1:Small (1-33%)] [N/A:N/A] Necrotic Tissue: [1:Eschar] [N/A:N/A] Exposed Structures: [1:Fascia: No Fat: No Tendon: No Muscle: No Joint: No Bone: No] [N/A:N/A] Limited to Skin Breakdown Epithelialization: Small (1-33%) N/A N/A Periwound Skin Texture: Edema: No N/A N/A Excoriation: No Induration: No Callus: No Crepitus: No Fluctuance: No Friable: No Rash: No Scarring: No Periwound Skin Moist: Yes N/A N/A Moisture: Dry/Scaly: Yes Maceration: No Periwound Skin Color: Atrophie Blanche: No  N/A N/A Cyanosis: No Ecchymosis: No Erythema: No Hemosiderin Staining: No Mottled: No Pallor: No Rubor: No Temperature: No Abnormality N/A N/A Tenderness on Yes N/A N/A Palpation: Janice Preparation: Ulcer Cleansing: N/A N/A Rinsed/Irrigated with Saline Topical Anesthetic Applied: Other: lidocaine 4% Treatment Notes Electronic Signature(s) Signed: 08/08/2015 5:26:34 PM By: Gretta Cool, RN, BSN, Kim RN, BSN Entered By: Gretta Cool, RN, BSN, Kim on 08/07/2015 09:22:07 [name] ([id number]) -------------------------------------------------------------------------------- [place] Details Patient Name: [name], [name]. Date of Service: 08/07/2015 8:45 AM Medical Record Number: [id number] Patient Account Number: [id number] Date of Birth/Sex: 12-20-1965 (49 y.o. Female) Treating RN: Cornell Barman Primary Care Physician: Briscoe Deutscher Other Clinician: Referring Physician: Briscoe Deutscher Treating Physician/Extender: Frann Rider in Treatment: 2 Active Inactive Necrotic Tissue Nursing Diagnoses: Impaired tissue integrity related to necrotic/devitalized tissue Goals: Necrotic/devitalized tissue will be minimized in the Janice bed Date Initiated: 07/24/2015 Goal Status: Active Patient/caregiver will verbalize understanding of reason and process for  debridement of necrotic tissue Date Initiated: 07/24/2015 Goal Status: Active Interventions: Assess patient pain level pre-, during and post procedure and prior to discharge Provide education on necrotic tissue and debridement process Treatment Activities: Apply topical anesthetic as ordered : 08/07/2015 Notes: Orientation to the [facility] Nursing Diagnoses: Knowledge deficit related to the [facility] program Goals: Patient/caregiver will verbalize understanding of the [facility] Date Initiated: 07/24/2015 Goal Status: Active Interventions: Provide education on orientation to the [facility] center Notes: [name], [name]. ([id number]) Janice/Skin Impairment Nursing Diagnoses: Impaired tissue integrity Goals: Ulcer/skin breakdown will have a volume reduction of 30% by week 4 Date Initiated: 07/24/2015 Goal Status: Active Ulcer/skin breakdown will have a volume reduction of 50% by week 8 Date Initiated: 07/24/2015 Goal Status: Active Ulcer/skin breakdown will have a volume reduction of 80% by week 12 Date Initiated: 07/24/2015 Goal Status: Active Ulcer/skin breakdown will heal within 14 weeks Date Initiated: 07/24/2015 Goal Status: Active Interventions: Assess patient/caregiver ability to obtain necessary supplies Assess ulceration(s) every visit Notes: Electronic Signature(s) Signed: 08/08/2015 5:26:34 PM By: Gretta Cool, RN, BSN, Kim RN, BSN Entered By: Gretta Cool, RN, BSN, Kim on 08/07/2015 09:21:59 [name], [name] ([id number]) -------------------------------------------------------------------------------- Pain Assessment Details Patient Name: Janice Gong E. Date of Service: 08/07/2015 8:45 AM Medical Record Number: [id number] Patient Account Number: [id number] Date of Birth/Sex: 1966/08/21 (49 y.o. Female) Treating RN: Junious Dresser Primary Care Physician: Briscoe Deutscher Other Clinician: Referring Physician: Briscoe Deutscher Treating  Physician/Extender: Frann Rider in Treatment: 2 Active Problems Location of Pain Severity and Description of Pain Patient Has Paino Yes Site Locations Pain Location: Pain in Ulcers Duration of the Pain. Constant / Intermittento Intermittent Rate the pain. Current Pain Level: 2 Pain Management and Medication Current Pain Management: Electronic Signature(s) Signed: 08/14/2015 4:32:11 PM By: Junious Dresser RN Entered By: Junious Dresser on 08/07/2015 09:05:33 [name] ([id number]) -------------------------------------------------------------------------------- Patient/Caregiver Education Details Patient Name: [name]. Date of Service: 08/07/2015 8:45 AM Medical Record Number: [id number] Patient Account Number: [id number] Date of Birth/Gender: 01-Sep-1966 (49 y.o. Female) Treating RN: Junious Dresser Primary Care Physician: Briscoe Deutscher Other Clinician: Referring Physician: Briscoe Deutscher Treating Physician/Extender: Frann Rider in Treatment: 2 Education Assessment Education Provided To: Patient Education Topics Provided Infection: Methods: Explain/Verbal Responses: State content correctly Janice Debridement: Methods: Explain/Verbal Responses: State content correctly Janice/Skin Impairment: Methods: Explain/Verbal Responses: State content correctly Electronic Signature(s) Signed: 08/14/2015 4:32:11 PM By: Junious Dresser RN Entered By: Junious Dresser on 08/07/2015 [name], [name] ([id number]) -------------------------------------------------------------------------------- Janice Assessment Details Patient Name: Janice Gong  E. Date of Service: 08/07/2015 8:45 AM Medical Record Number: [id number] Patient Account Number: [id number] Date of Birth/Sex: Jul 10, 1966 (49 y.o. Female) Treating RN: Junious Dresser Primary Care Physician: Briscoe Deutscher Other Clinician: Referring Physician: Briscoe Deutscher Treating Physician/Extender: Frann Rider in Treatment: 2 Janice Status Janice Number: 1 Primary Etiology: Cellulitis Janice Location: Left Ischium - Lateral Janice Status: Open Wounding Event: Bite Date Acquired: 06/18/2015 Weeks Of Treatment: 2 Clustered Janice: No Photos Photo Uploaded By: Junious Dresser on 08/07/2015 16:31:09 Janice Measurements Length: (cm) 2.5 Width: (cm) 4 Depth: (cm) 0.1 Area: (cm) 7.854 Volume: (cm) 0.785 % Reduction in Area: 49.9% % Reduction in Volume: 75% Epithelialization: Small (1-33%) Tunneling: No Undermining: No Janice Description Full Thickness Without Exposed Classification: Support Structures Janice Margin: Flat and Intact Exudate Small Amount: Exudate Type: Serous Exudate Color: amber Foul Odor After Cleansing: No Janice Bed Granulation Amount: Large (67-100%) Exposed Structure Granulation Quality: Red Fascia Exposed: No Necrotic Amount: Small (1-33%) Fat Layer Exposed: No [name], Lolamae E. ([id number]) Necrotic Quality: Eschar Tendon Exposed: No Muscle Exposed: No Joint Exposed: No Bone Exposed: No Limited to Skin Breakdown Periwound Skin Texture Texture Color No Abnormalities Noted: No No Abnormalities Noted: No Callus: No Atrophie Blanche: No Crepitus: No Cyanosis: No Excoriation: No Ecchymosis: No Fluctuance: No Erythema: No Friable: No Hemosiderin Staining: No Induration: No Mottled: No Localized Edema: No Pallor: No Rash: No Rubor: No Scarring: No Temperature / Pain Moisture Temperature: No Abnormality No Abnormalities Noted: No Tenderness on Palpation: Yes Dry / Scaly: Yes Maceration: No Moist: Yes Janice Preparation Ulcer Cleansing: Rinsed/Irrigated with Saline Topical Anesthetic Applied: Other: lidocaine 4%, Electronic Signature(s) Signed: 08/14/2015 4:32:11 PM By: Junious Dresser RN Entered By: Junious Dresser on 08/07/2015 09:13:47 Howlett, Floria E.  ([id number]) -------------------------------------------------------------------------------- Vitals Details Patient Name: [name]. Date of Service: 08/07/2015 8:45 AM Medical Record Number: [id number] Patient Account Number: [id number] Date of Birth/Sex: May 28, 1966 (49 y.o. Female) Treating RN: Junious Dresser Primary Care Physician: Briscoe Deutscher Other Clinician: Referring Physician: Briscoe Deutscher Treating Physician/Extender: Frann Rider in Treatment: 2 Vital Signs Time Taken: 09:05 Temperature (F): 98.3 Height (in): 65 Pulse (bpm): 70 Weight (lbs): 168 Respiratory Rate (breaths/min): 12 Body Mass Index (BMI): 28 Blood Pressure (mmHg): 119/66 Reference Range: 80 - 120 mg / dl Electronic Signature(s) Signed: 08/14/2015 4:32:11 PM By: Junious Dresser RN Entered By: Junious Dresser on 08/07/2015 09:06:00

## 2015-08-15 NOTE — Progress Notes (Signed)
RUCHA, WISSINGER ([id number]) Visit Report for 08/14/2015 Chief Complaint Document Details Patient Name: [name], [name]. Date of Service: 08/14/2015 10:00 AM Medical Record Number: [id number] Patient Account Number: [id number] Date of Birth/Sex: 05-19-1966 (49 y.o. Female) Treating RN: Montey Hora Primary Care Physician: Briscoe Deutscher Other Clinician: Referring Physician: Briscoe Deutscher Treating Physician/Extender: Frann Rider in Treatment: 3 Information Obtained from: Patient Chief Complaint Patient presents to the wound care center for a consult due non healing wound. She has had a wound on her left upper thigh and buttock region for about a month. Electronic Signature(s) Signed: 08/14/2015 10:44:47 AM By: Christin Fudge MD, FACS Entered By: Christin Fudge on 08/14/2015 10:44:46 Deyton, Edwinna Areola ([id number]) -------------------------------------------------------------------------------- HPI Details Patient Name: [name], [name] Date of Service: 08/14/2015 10:00 AM Medical Record Number: [id number] Patient Account Number: [id number] Date of Birth/Sex: Dec 06, 1965 (49 y.o. Female) Treating RN: Montey Hora Primary Care Physician: Briscoe Deutscher Other Clinician: Referring Physician: Briscoe Deutscher Treating Physician/Extender: Frann Rider in Treatment: 3 History of Present Illness Location: left lateral upper thigh and hip region Quality: Patient reports experiencing a dull pain to affected area(s). Severity: Patient states wound are getting worse. Duration: Patient has had the wound for < 4 weeks prior to presenting for treatment Timing: Pain in wound is Intermittent (comes and goes Context: The wound occurred when the patient possibly had a insect bite to the left hip and buttock region Modifying Factors: Other treatment(s) tried include: local care was done and recently was put on doxycycline for this. Associated Signs and Symptoms: Wound has purlulent  drainage HPI Description: She has no comorbidities and was initially thought to have a insect bite which gradually caused a large purulent abscess with drainage. Due to lack of insurance she did not seek any medical help but continued over-the-counter remedies for this. She was recently seen by her PCP Dr. Briscoe Deutscher who reviewed her wound and besides putting on doxycycline was sent to the wound center. 08/07/2015 -- her wound is doing fine and she has some issues with the dressing sticking to her skin and difficult to remove it. Other than that she is doing fine. Electronic Signature(s) Signed: 08/14/2015 10:44:56 AM By: Christin Fudge MD, FACS Entered By: Christin Fudge on 08/14/2015 10:44:56 [name] ([id number]) -------------------------------------------------------------------------------- Physical Exam Details Patient Name: [name], [name] Date of Service: 08/14/2015 10:00 AM Medical Record Number: [id number] Patient Account Number: [id number] Date of Birth/Sex: [birth year] (49 y.o. Female) Treating RN: Montey Hora Primary Care Physician: Briscoe Deutscher Other Clinician: Referring Physician: Briscoe Deutscher Treating Physician/Extender: Frann Rider in Treatment: 3 Constitutional . Pulse regular. Respirations normal and unlabored. Afebrile. . Eyes Nonicteric. Reactive to light. Ears, Nose, Mouth, and Throat Lips, teeth, and gums WNL.Marland Kitchen Moist mucosa without lesions . Neck supple and nontender. No palpable supraclavicular or cervical adenopathy. Normal sized without goiter. Respiratory WNL. No retractions.. Cardiovascular Pedal Pulses WNL. No clubbing, cyanosis or edema. Lymphatic No adneopathy. No adenopathy. No adenopathy. Musculoskeletal Adexa without tenderness or enlargement.. Digits and nails w/o clubbing, cyanosis, infection, petechiae, ischemia, or inflammatory conditions.. Integumentary (Hair, Skin) No suspicious lesions. No crepitus or  fluctuance. No peri-wound warmth or erythema. No masses.Marland Kitchen Psychiatric Judgement and insight Intact.. No evidence of depression, anxiety, or agitation.. Notes the wound looks good and has healthy granulation tissue and is smaller. Electronic Signature(s) Signed: 08/14/2015 10:45:37 AM By: Christin Fudge MD, FACS Entered By: Christin Fudge on 08/14/2015 10:45:36 [name] ([id number]) -------------------------------------------------------------------------------- Physician Orders Details Patient Name: [name].  Date of Service: 08/14/2015 10:00 AM Medical Record Number: [id number] Patient Account Number: [id number] Date of Birth/Sex: [birth year] (49 y.o. Female) Treating RN: Montey Hora Primary Care Physician: Briscoe Deutscher Other Clinician: Referring Physician: Briscoe Deutscher Treating Physician/Extender: Frann Rider in Treatment: 3 Verbal / Phone Orders: Yes Clinician: Montey Hora Read Back and Verified: Yes Diagnosis Coding Wound Cleansing Wound #1 Left,Lateral Ischium o Clean wound with Normal Saline. Anesthetic Wound #1 Left,Lateral Ischium o Topical Lidocaine 4% cream applied to wound bed prior to debridement Primary Wound Dressing Wound #1 Left,Lateral Ischium o Prisma Ag Secondary Dressing Wound #1 Left,Lateral Ischium o ABD pad Dressing Change Frequency Wound #1 Left,Lateral Ischium o Dressing is to be changed Monday and Thursday. Follow-up Appointments Wound #1 Left,Lateral Ischium o Return Appointment in 2 weeks. Electronic Signature(s) Signed: 08/14/2015 4:36:30 PM By: Christin Fudge MD, FACS Signed: 08/14/2015 4:58:54 PM By: Montey Hora Entered By: Montey Hora on 08/14/2015 10:36:52 Pedretti, Azariyah EMarland Kitchen ([id number]) -------------------------------------------------------------------------------- Problem List Details Patient Name: [name], [name] Date of Service: 08/14/2015 10:00 AM Medical Record Number:  [id number] Patient Account Number: [id number] Date of Birth/Sex: February 20, 1966 (49 y.o. 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 ([id number]) -------------------------------------------------------------------------------- Progress Note Details Patient Name: Janice Gong E. Date of Service: 08/14/2015 10:00 AM Medical Record Number: [id number] Patient Account Number: [id number] Date of Birth/Sex: Nov 11, 1966 (49 y.o. Female) Treating RN: Montey Hora Primary Care Physician: Briscoe Deutscher Other Clinician: Referring Physician: Briscoe Deutscher Treating Physician/Extender: Frann Rider in Treatment: 3 Subjective Chief Complaint Information obtained from Patient Patient presents to the wound care center for a consult due non healing wound. She has had a wound on her left upper thigh and buttock region for about a month. History of Present Illness (HPI) The following HPI elements were documented for the patient's wound: Location: left lateral upper thigh and hip region Quality: Patient reports experiencing a dull pain to affected area(s). Severity: Patient states wound are getting worse. Duration: Patient has had the wound for < 4 weeks prior to presenting for treatment Timing: Pain in wound is Intermittent (comes and goes Context: The wound occurred when the patient  possibly had a insect bite to the left hip and buttock region Modifying Factors: Other treatment(s) tried include: local care was done and recently was put on doxycycline for this. Associated Signs and Symptoms: Wound has purlulent drainage She has no comorbidities and was initially thought to have a insect bite which gradually caused a large purulent abscess with drainage. Due to lack of insurance she did not seek any medical help but continued over-the-counter remedies for this. She was recently seen by her PCP Dr. Briscoe Deutscher who reviewed her wound and besides putting on doxycycline was sent to the wound center. 08/07/2015 -- her wound is doing fine and she has some issues with the dressing sticking to her skin and difficult to remove it. Other than that she is doing fine. Objective Constitutional Pulse regular. Respirations normal and unlabored. Afebrile. Vitals Time Taken: 10:13 AM, Height: 65 in, Weight: 168 lbs, BMI: 28,  Temperature: 98.4 F, Pulse: 76 bpm, Respiratory Rate: 14 breaths/min, Blood Pressure: 131/64 mmHg. [name], [name] ([id number]) Eyes Nonicteric. Reactive to light. Ears, Nose, Mouth, and Throat Lips, teeth, and gums WNL.Marland Kitchen Moist mucosa without lesions . Neck supple and nontender. No palpable supraclavicular or cervical adenopathy. Normal sized without goiter. Respiratory WNL. No retractions.. Cardiovascular Pedal Pulses WNL. No clubbing, cyanosis or edema. Lymphatic No adneopathy. No adenopathy. No adenopathy. Musculoskeletal Adexa without tenderness or enlargement.. Digits and nails w/o clubbing, cyanosis, infection, petechiae, ischemia, or inflammatory conditions.Marland Kitchen Psychiatric Judgement and insight Intact.. No evidence of depression, anxiety, or agitation.. General Notes: the wound looks good and has healthy granulation tissue and is smaller. Integumentary (Hair, Skin) No suspicious lesions. No crepitus or fluctuance. No peri-wound warmth or  erythema. No masses.. Wound #1 status is Open. Original cause of wound was Bite. The wound is located on the Left,Lateral Ischium. The wound measures 2.2cm length x 3.7cm width x 0.1cm depth; 6.393cm^2 area and 0.639cm^3 volume. The wound is limited to skin breakdown. There is no tunneling or undermining noted. There is a small amount of serous drainage noted. The wound margin is flat and intact. There is large (67-100%) red granulation within the wound bed. There is a small (1-33%) amount of necrotic tissue within the wound bed including Eschar. The periwound skin appearance exhibited: Dry/Scaly, Moist. The periwound skin appearance did not exhibit: Callus, Crepitus, Excoriation, Fluctuance, Friable, Induration, Localized Edema, Rash, Scarring, Maceration, Atrophie Blanche, Cyanosis, Ecchymosis, Hemosiderin Staining, Mottled, Pallor, Rubor, Erythema. Periwound temperature was noted as No Abnormality. The periwound has tenderness on palpation. Assessment Active Problems [name], [name]. 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 Plan Wound Cleansing: Wound #1 Left,Lateral Ischium: Clean wound with Normal Saline. Anesthetic: Wound #1 Left,Lateral Ischium: Topical Lidocaine 4% cream applied to wound bed prior to debridement Primary Wound Dressing: Wound #1 Left,Lateral Ischium: Prisma Ag Secondary Dressing: Wound #1 Left,Lateral Ischium: ABD pad Dressing Change Frequency: Wound #1 Left,Lateral Ischium: Dressing is to be changed Monday and Thursday. Follow-up Appointments: Wound #1 Left,Lateral Ischium: Return Appointment in 2 weeks. I have recommended we  continue silver collagen and this can be changed on alternate days and due to lack of insurance she would like to come back to see as in 2 weeks' time. Electronic Signature(s) Signed: 08/14/2015 10:46:08 AM By: Christin Fudge MD, FACS Entered By: Christin Fudge on 08/14/2015 10:46:08 LISAANN, ATHA ([id number]) Geier, Edwinna Areola ([id number]) -------------------------------------------------------------------------------- SuperBill Details Patient Name: [name]. Date of Service: 08/14/2015 Medical Record Number: [id number] Patient Account Number: [id number] Date of Birth/Sex: 09/27/1966 (49 y.o. Female) Treating RN: Montey Hora Primary Care Physician: Briscoe Deutscher Other Clinician: Referring Physician: Briscoe Deutscher Treating Physician/Extender: Frann Rider in Treatment: 3 Diagnosis Coding ICD-10 Codes Code Description L03.317 Cellulitis of buttock L98.412 Non-pressure chronic ulcer of buttock with fat layer exposed S70.362A Insect bite (nonvenomous), left thigh, initial encounter Facility Procedures CPT4 Code: 14431540 Description: 08676 - WOUND CARE VISIT-LEV 2 EST PT Modifier: Quantity: 1 Physician Procedures CPT4 Code: 1950932 Description: 67124 - WC PHYS LEVEL 3 - EST PT ICD-10 Description Diagnosis L98.412 Non-pressure chronic ulcer of buttock with fat l S70.362A Insect bite (nonvenomous), left thigh, initial  e Modifier: ayer exposed ncounter Quantity: 1 Electronic Signature(s) Signed: 08/14/2015 10:46:29 AM By: Christin Fudge MD, FACS Entered By: Christin Fudge on 08/14/2015 10:46:29

## 2015-08-28 ENCOUNTER — Encounter: Payer: Self-pay | Admitting: Surgery

## 2015-08-28 NOTE — Progress Notes (Addendum)
ZELIA, YZAGUIRRE ([id number]) Visit Report for 08/28/2015 Chief Complaint Document Details Patient Name: [name], [name]. Date of Service: 08/28/2015 8:45 AM Medical Record Number: [id number] Patient Account Number: [id number] Date of Birth/Sex: [birth year] (49 y.o. Female) Treating RN: Montey Hora Primary Care Physician: Briscoe Deutscher Other Clinician: Referring Physician: Briscoe Deutscher Treating Physician/Extender: Frann Rider in Treatment: 5 Information Obtained from: Patient Chief Complaint Patient presents to the wound care center for a consult due non healing wound. She has had a wound on her left upper thigh and buttock region for about a month. Electronic Signature(s) Signed: 08/28/2015 9:16:47 AM By: Christin Fudge MD, FACS Entered By: Christin Fudge on 08/28/2015 09:16:47 Guess, Edwinna Areola ([id number]) -------------------------------------------------------------------------------- HPI Details Patient Name: DESTYNI, HOPPEL E. Date of Service: 08/28/2015 8:45 AM Medical Record Number: [id number] Patient Account Number: [id number] Date of Birth/Sex: [birth year] (49 y.o. Female) Treating RN: Montey Hora Primary Care Physician: Briscoe Deutscher Other Clinician: Referring Physician: Briscoe Deutscher Treating Physician/Extender: Frann Rider in Treatment: 5 History of Present Illness Location: left lateral upper thigh and hip region Quality: Patient reports experiencing a dull pain to affected area(s). Severity: Patient states wound are getting worse. Duration: Patient has had the wound for < 4 weeks prior to presenting for treatment Timing: Pain in wound is Intermittent (comes and goes Context: The wound occurred when the patient possibly had a insect bite to the left hip and buttock region Modifying Factors: Other treatment(s) tried include: local care was done and recently was put on doxycycline for this. Associated Signs and Symptoms: Wound has  purlulent drainage HPI Description: She has no comorbidities and was initially thought to have a insect bite which gradually caused a large purulent abscess with drainage. Due to lack of insurance she did not seek any medical help but continued over-the-counter remedies for this. She was recently seen by her PCP Dr. Briscoe Deutscher who reviewed her wound and besides putting on doxycycline was sent to the wound center. 08/07/2015 -- her wound is doing fine and she has some issues with the dressing sticking to her skin and difficult to remove it. Other than that she is doing fine. 08/28/2015 -- she has been doing a dressing on every third day with silver collagen. No fresh issues. Electronic Signature(s) Signed: 08/28/2015 9:17:16 AM By: Christin Fudge MD, FACS Entered By: Christin Fudge on 08/28/2015 09:17:16 [name] ([id number]) -------------------------------------------------------------------------------- Physical Exam Details Patient Name: [name], [name] Date of Service: 08/28/2015 8:45 AM Medical Record Number: [id number] Patient Account Number: [id number] Date of Birth/Sex: [birth year] (49 y.o. Female) Treating RN: Montey Hora Primary Care Physician: Briscoe Deutscher Other Clinician: Referring Physician: Briscoe Deutscher Treating Physician/Extender: Frann Rider in Treatment: 5 Constitutional . Pulse regular. Respirations normal and unlabored. Afebrile. . Eyes Nonicteric. Reactive to light. Ears, Nose, Mouth, and Throat Lips, teeth, and gums WNL.Marland Kitchen Moist mucosa without lesions . Neck supple and nontender. No palpable supraclavicular or cervical adenopathy. Normal sized without goiter. Respiratory WNL. No retractions.. Cardiovascular Pedal Pulses WNL. No clubbing, cyanosis or edema. Lymphatic No adneopathy. No adenopathy. No adenopathy. Musculoskeletal Adexa without tenderness or enlargement.. Digits and nails w/o clubbing, cyanosis, infection,  petechiae, ischemia, or inflammatory conditions.. Integumentary (Hair, Skin) No suspicious lesions. No crepitus or fluctuance. No peri-wound warmth or erythema. No masses.Marland Kitchen Psychiatric Judgement and insight Intact.. No evidence of depression, anxiety, or agitation.. Notes the wound on the left gluteal area has a small surface of ulceration and the surrounding skin is fragile but has some debris which was  washed off with saline and a few pieces of debris removed with a forcep. Electronic Signature(s) Signed: 08/28/2015 9:17:55 AM By: Christin Fudge MD, FACS Entered By: Christin Fudge on 08/28/2015 09:17:54 [name] ([id number]) -------------------------------------------------------------------------------- Physician Orders Details Patient Name: LAURELAI, LEPP E. Date of Service: 08/28/2015 8:45 AM Medical Record Number: [id number] Patient Account Number: [id number] Date of Birth/Sex: 18-Mar-1966 (49 y.o. Female) Treating RN: Cornell Barman Primary Care Physician: Briscoe Deutscher Other Clinician: Referring Physician: Briscoe Deutscher Treating Physician/Extender: Frann Rider in Treatment: 5 Verbal / Phone Orders: Yes Clinician: Cornell Barman Read Back and Verified: Yes Diagnosis Coding Wound Cleansing Wound #1 Left,Lateral Ischium o Clean wound with Normal Saline. Anesthetic Wound #1 Left,Lateral Ischium o Topical Lidocaine 4% cream applied to wound bed prior to debridement Primary Wound Dressing Wound #1 Left,Lateral Ischium o Prisma Ag Secondary Dressing Wound #1 Left,Lateral Ischium o ABD pad Dressing Change Frequency Wound #1 Left,Lateral Ischium o Dressing is to be changed Monday and Thursday. Follow-up Appointments Wound #1 Left,Lateral Ischium o Return Appointment in 2 weeks. Electronic Signature(s) Signed: 08/28/2015 4:43:13 PM By: Christin Fudge MD, FACS Signed: 08/28/2015 6:09:37 PM By: Gretta Cool RN, BSN, Kim RN, BSN Entered By: Gretta Cool, RN, BSN,  Kim on 08/28/2015 09:21:32 [name], [name] ([id number]) -------------------------------------------------------------------------------- Problem List Details Patient Name: [name], [name]. Date of Service: 08/28/2015 8:45 AM Medical Record Number: [id number] Patient Account Number: [id number] Date of Birth/Sex: 1966/03/28 (49 y.o. 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 ([id number]) -------------------------------------------------------------------------------- Progress Note Details Patient Name: [name]. Date of Service: 08/28/2015 8:45 AM Medical Record Number: [id number] Patient Account Number: [id number] Date of Birth/Sex: Apr 01, 1966 (49 y.o. Female) Treating RN: Montey Hora Primary Care Physician: Briscoe Deutscher Other Clinician: Referring Physician: Briscoe Deutscher Treating Physician/Extender: Frann Rider in Treatment: 5 Subjective Chief Complaint Information obtained from Patient Patient presents to the wound care center for a consult due non healing wound. She has had a wound on her left upper thigh and buttock region for about a month. History of Present Illness (HPI) The following HPI elements were documented for the patient's wound: Location: left lateral upper thigh and hip  region Quality: Patient reports experiencing a dull pain to affected area(s). Severity: Patient states wound are getting worse. Duration: Patient has had the wound for < 4 weeks prior to presenting for treatment Timing: Pain in wound is Intermittent (comes and goes Context: The wound occurred when the patient possibly had a insect bite to the left hip and buttock region Modifying Factors: Other treatment(s) tried include: local care was done and recently was put on doxycycline for this. Associated Signs and Symptoms: Wound has purlulent drainage She has no comorbidities and was initially thought to have a insect bite which gradually caused a large purulent abscess with drainage. Due to lack of insurance she did not seek any medical help but continued over-the-counter remedies for this. She was recently seen by her PCP Dr. Briscoe Deutscher who reviewed her wound and besides putting on doxycycline was  sent to the wound center. 08/07/2015 -- her wound is doing fine and she has some issues with the dressing sticking to her skin and difficult to remove it. Other than that she is doing fine. 08/28/2015 -- she has been doing a dressing on every third day with silver collagen. No fresh issues. Objective Constitutional Pulse regular. Respirations normal and unlabored. Afebrile. Vitals Time Taken: 8:58 AM, Height: 65 in, Weight: 168 lbs, BMI: 28, Temperature: 98.5 F, Pulse: 77 bpm, Respiratory Rate: 16 breaths/min, Blood Pressure: 129/63 mmHg. [name], [name] ([id number]) Eyes Nonicteric. Reactive to light. Ears, Nose, Mouth, and Throat Lips, teeth, and gums WNL.Marland Kitchen Moist mucosa without lesions . Neck supple and nontender. No palpable supraclavicular or cervical adenopathy. Normal sized without goiter. Respiratory WNL. No retractions.. Cardiovascular Pedal Pulses WNL. No clubbing, cyanosis or edema. Lymphatic No adneopathy. No adenopathy. No adenopathy. Musculoskeletal Adexa without  tenderness or enlargement.. Digits and nails w/o clubbing, cyanosis, infection, petechiae, ischemia, or inflammatory conditions.Marland Kitchen Psychiatric Judgement and insight Intact.. No evidence of depression, anxiety, or agitation.. General Notes: the wound on the left gluteal area has a small surface of ulceration and the surrounding skin is fragile but has some debris which was washed off with saline and a few pieces of debris removed with a forcep. Integumentary (Hair, Skin) No suspicious lesions. No crepitus or fluctuance. No peri-wound warmth or erythema. No masses.. Wound #1 status is Open. Original cause of wound was Bite. The wound is located on the Left,Lateral Ischium. The wound measures 0.5cm length x 1.5cm width x 0.1cm depth; 0.589cm^2 area and 0.059cm^3 volume. The wound is limited to skin breakdown. There is no tunneling or undermining noted. There is a small amount of serous drainage noted. The wound margin is flat and intact. There is large (67-100%) red granulation within the wound bed. There is a small (1-33%) amount of necrotic tissue within the wound bed including Eschar. The periwound skin appearance exhibited: Scarring, Moist. The periwound skin appearance did not exhibit: Callus, Crepitus, Excoriation, Fluctuance, Friable, Induration, Localized Edema, Rash, Dry/Scaly, Maceration, Atrophie Blanche, Cyanosis, Ecchymosis, Hemosiderin Staining, Mottled, Pallor, Rubor, Erythema. Periwound temperature was noted as No Abnormality. The periwound has tenderness on palpation. General Notes: dressing is sticking to peri-wound area. [name], [name] ([id number]) Assessment Active Problems ICD-10 [phone] - Non-pressure chronic ulcer of buttock with fat layer exposed S70.362A - Insect bite (nonvenomous), left thigh, initial encounter I have recommended we continue silver collagen and this can be changed on alternate days and due to lack of insurance she would like to come back to see as  in 2 weeks' time. Plan Wound Cleansing: Wound #1 Left,Lateral Ischium: Clean wound with Normal Saline. Anesthetic: Wound #1 Left,Lateral Ischium: Topical Lidocaine 4% cream applied to wound bed prior to debridement Primary Wound Dressing: Wound #1 Left,Lateral Ischium: Prisma Ag Secondary Dressing: Wound #1 Left,Lateral Ischium: ABD pad Dressing Change Frequency: Wound #1 Left,Lateral Ischium: Dressing is to be changed Monday and Thursday. Follow-up Appointments: Wound #1 Left,Lateral Ischium: Return Appointment in 2 weeks. I have recommended we continue silver collagen and this can be changed on alternate days and due to lack of insurance she would like to come back to see as in 2 weeks' time. [name], [name] ([id number]) Electronic Signature(s) Signed: 08/29/2015 4:18:35 PM By: Christin Fudge MD, FACS Previous Signature: 08/28/2015 9:18:31 AM Version By: Christin Fudge MD, FACS Previous Signature: 08/28/2015 9:18:20 AM Version By: Christin Fudge MD, FACS Entered By: Christin Fudge on 08/29/2015 16:18:35 [name], [name] ([id number]) --------------------------------------------------------------------------------  SuperBill Details Patient Name: [name], [name]. Date of Service: 08/28/2015 Medical Record Number: [id number] Patient Account Number: [id number] Date of Birth/Sex: 15-Jun-1966 (49 y.o. Female) Treating RN: Montey Hora Primary Care Physician: Briscoe Deutscher Other Clinician: Referring Physician: Briscoe Deutscher Treating Physician/Extender: Frann Rider in Treatment: 5 Diagnosis Coding ICD-10 Codes Code Description [phone] Non-pressure chronic ulcer of buttock with fat layer exposed S70.362A Insect bite (nonvenomous), left thigh, initial encounter Facility Procedures CPT4 Code: 46962952 Description: [phone] - WOUND CARE VISIT-LEV 2 EST PT Modifier: Quantity: 1 Physician Procedures CPT4 Code: 4401027 Description: 25366 - WC PHYS LEVEL 3 - EST PT  ICD-10 Description Diagnosis L98.412 Non-pressure chronic ulcer of buttock with fat l S70.362A Insect bite (nonvenomous), left thigh, initial e Modifier: ayer exposed ncounter Quantity: 1 Electronic Signature(s) Signed: 08/28/2015 4:43:13 PM By: Christin Fudge MD, FACS Signed: 08/28/2015 6:09:37 PM By: Gretta Cool RN, BSN, Kim RN, BSN Previous Signature: 08/28/2015 9:18:44 AM Version By: Christin Fudge MD, FACS Entered By: Gretta Cool, RN, BSN, Kim on 08/28/2015 09:22:17

## 2015-08-29 NOTE — Progress Notes (Signed)
KHRISTEN, CHEYNEY ([id number]) Visit Report for 08/28/2015 Arrival Information Details Patient Name: RAYAAN, LORAH. Date of Service: 08/28/2015 8:45 AM Medical Record Number: [id number] Patient Account Number: [id number] Date of Birth/Sex: 1966-02-18 (49 y.o. Female) Treating RN: Cornell Barman Primary Care Physician: Briscoe Deutscher Other Clinician: Referring Physician: Briscoe Deutscher Treating Physician/Extender: Frann Rider in Treatment: 5 Visit Information History Since Last Visit Added or deleted any medications: No Patient Arrived: Ambulatory Any new allergies or adverse reactions: No Arrival Time: 08:56 Had a fall or experienced change in No Accompanied By: self activities of daily living that may affect Transfer Assistance: None risk of falls: Patient Identification Verified: Yes Signs or symptoms of abuse/neglect since last No Secondary Verification Process Yes visito Completed: Hospitalized since last visit: No Has Dressing in Place as Prescribed: Yes Pain Present Now: Yes Electronic Signature(s) Signed: 08/28/2015 6:09:37 PM By: Gretta Cool, RN, BSN, Kim RN, BSN Entered By: Gretta Cool, RN, BSN, Kim on 08/28/2015 08:57:21 Radford, Edwinna Areola ([id number]) -------------------------------------------------------------------------------- Clinic Level of Care Assessment Details Patient Name: GRAMS, Honesti E. Date of Service: 08/28/2015 8:45 AM Medical Record Number: [id number] Patient Account Number: [id number] Date of Birth/Sex: 09/30/1966 (49 y.o. Female) Treating RN: Cornell Barman Primary Care Physician: Briscoe Deutscher Other Clinician: Referring Physician: Briscoe Deutscher Treating Physician/Extender: Frann Rider in Treatment: 5 Clinic Level of Care Assessment Items TOOL 4 Quantity Score []  - Use when only an EandM is performed on FOLLOW-UP visit 0 ASSESSMENTS - Nursing Assessment / Reassessment []  - Reassessment of Co-morbidities (includes updates in patient  status) 0 X - Reassessment of Adherence to Treatment Plan 1 5 ASSESSMENTS - Wound and Skin Assessment / Reassessment X - Simple Wound Assessment / Reassessment - one wound 1 5 []  - Complex Wound Assessment / Reassessment - multiple wounds 0 []  - Dermatologic / Skin Assessment (not related to wound area) 0 ASSESSMENTS - Focused Assessment []  - Circumferential Edema Measurements - multi extremities 0 []  - Nutritional Assessment / Counseling / Intervention 0 []  - Lower Extremity Assessment (monofilament, tuning fork, pulses) 0 []  - Peripheral Arterial Disease Assessment (using hand held doppler) 0 ASSESSMENTS - Ostomy and/or Continence Assessment and Care []  - Incontinence Assessment and Management 0 []  - Ostomy Care Assessment and Management (repouching, etc.) 0 PROCESS - Coordination of Care X - Simple Patient / Family Education for ongoing care 1 15 []  - Complex (extensive) Patient / Family Education for ongoing care 0 []  - Staff obtains Programmer, systems, Records, Test Results / Process Orders 0 []  - Staff telephones HHA, Nursing Homes / Clarify orders / etc 0 []  - Routine Transfer to another Facility (non-emergent condition) 0 Umbaugh, Shaney E. ([id number]) []  - Routine Hospital Admission (non-emergent condition) 0 []  - New Admissions / Biomedical engineer / Ordering NPWT, Apligraf, etc. 0 []  - Emergency Hospital Admission (emergent condition) 0 X - Simple Discharge Coordination 1 10 []  - Complex (extensive) Discharge Coordination 0 PROCESS - Special Needs []  - Pediatric / Minor Patient Management 0 []  - Isolation Patient Management 0 []  - Hearing / Language / Visual special needs 0 []  - Assessment of Community assistance (transportation, D/C planning, etc.) 0 []  - Additional assistance / Altered mentation 0 []  - Support Surface(s) Assessment (bed, cushion, seat, etc.) 0 INTERVENTIONS - Wound Cleansing / Measurement X - Simple Wound Cleansing - one wound 1 5 []  - Complex Wound  Cleansing - multiple wounds 0 X - Wound Imaging (photographs - any number of wounds) 1 5 []  - Wound Tracing (instead of photographs)  0 X - Simple Wound Measurement - one wound 1 5 []  - Complex Wound Measurement - multiple wounds 0 INTERVENTIONS - Wound Dressings X - Small Wound Dressing one or multiple wounds 1 10 []  - Medium Wound Dressing one or multiple wounds 0 []  - Large Wound Dressing one or multiple wounds 0 []  - Application of Medications - topical 0 []  - Application of Medications - injection 0 INTERVENTIONS - Miscellaneous []  - External ear exam 0 Yodice, Sylina E. ([id number]) []  - Specimen Collection (cultures, biopsies, blood, body fluids, etc.) 0 []  - Specimen(s) / Culture(s) sent or taken to Lab for analysis 0 []  - Patient Transfer (multiple staff / Harrel Lemon Lift / Similar devices) 0 []  - Simple Staple / Suture removal (25 or less) 0 []  - Complex Staple / Suture removal (26 or more) 0 []  - Hypo / Hyperglycemic Management (close monitor of Blood Glucose) 0 []  - Ankle / Brachial Index (ABI) - do not check if billed separately 0 X - Vital Signs 1 5 Has the patient been seen at the hospital within the last three years: Yes Total Score: 65 Level Of Care: New/Established - Level 2 Electronic Signature(s) Signed: 08/28/2015 6:09:37 PM By: Gretta Cool, RN, BSN, Kim RN, BSN Entered By: Gretta Cool, RN, BSN, Kim on 08/28/2015 09:21:54 [name] ([id number]) -------------------------------------------------------------------------------- Encounter Discharge Information Details Patient Name: [name]. Date of Service: 08/28/2015 8:45 AM Medical Record Number: [id number] Patient Account Number: [id number] Date of Birth/Sex: [birth year] (49 y.o. Female) Treating RN: Montey Hora Primary Care Physician: Briscoe Deutscher Other Clinician: Referring Physician: Briscoe Deutscher Treating Physician/Extender: Frann Rider in Treatment: 5 Encounter Discharge Information  Items Discharge Pain Level: 0 Discharge Condition: Stable Ambulatory Status: Ambulatory Discharge Destination: Home Transportation: Private Auto Accompanied By: self Schedule Follow-up Appointment: Yes Medication Reconciliation completed and provided to Patient/Care Yes Nyjah Denio: Provided on Clinical Summary of Care: 08/28/2015 Form Type Recipient Paper Patient [facility] Electronic Signature(s) Signed: 08/28/2015 6:09:37 PM By: Gretta Cool RN, BSN, Kim RN, BSN Previous Signature: 08/28/2015 9:20:55 AM Version By: Ruthine Dose Entered By: Gretta Cool RN, BSN, Kim on 08/28/2015 09:22:57 Hammerschmidt, Akaysha EMarland Kitchen ([id number]) -------------------------------------------------------------------------------- Multi Wound Chart Details Patient Name: [name] Date of Service: 08/28/2015 8:45 AM Medical Record Number: [id number] Patient Account Number: [id number] Date of Birth/Sex: [birth year] (49 y.o. Female) Treating RN: Cornell Barman Primary Care Physician: Briscoe Deutscher Other Clinician: Referring Physician: Briscoe Deutscher Treating Physician/Extender: Frann Rider in Treatment: 5 Vital Signs Height(in): 65 Pulse(bpm): 77 Weight(lbs): 168 Blood Pressure 129/63 (mmHg): Body Mass Index(BMI): 28 Temperature(F): 98.5 Respiratory Rate 16 (breaths/min): Photos: [1:No Photos] [N/A:N/A] Wound Location: [1:Left Ischium - Lateral] [N/A:N/A] Wounding Event: [1:Bite] [N/A:N/A] Primary Etiology: [1:Cellulitis] [N/A:N/A] Date Acquired: [1:06/18/2015] [N/A:N/A] Weeks of Treatment: [1:5] [N/A:N/A] Wound Status: [1:Open] [N/A:N/A] Measurements L x W x D 0.5x1.5x0.1 [N/A:N/A] (cm) Area (cm) : [1:0.589] [N/A:N/A] Volume (cm) : [1:0.059] [N/A:N/A] % Reduction in Area: [1:96.20%] [N/A:N/A] % Reduction in Volume: 98.10% [N/A:N/A] Classification: [1:Full Thickness Without Exposed Support Structures] [N/A:N/A] Exudate Amount: [1:Small] [N/A:N/A] Exudate Type: [1:Serous] [N/A:N/A] Exudate Color:  [1:amber] [N/A:N/A] Wound Margin: [1:Flat and Intact] [N/A:N/A] Granulation Amount: [1:Large (67-100%)] [N/A:N/A] Granulation Quality: [1:Red] [N/A:N/A] Necrotic Amount: [1:Small (1-33%)] [N/A:N/A] Necrotic Tissue: [1:Eschar] [N/A:N/A] Exposed Structures: [1:Fascia: No Fat: No Tendon: No Muscle: No Joint: No Bone: No] [N/A:N/A] Limited to Skin Breakdown Epithelialization: Small (1-33%) N/A N/A Periwound Skin Texture: Scarring: Yes N/A N/A Edema: No Excoriation: No Induration: No Callus: No Crepitus: No Fluctuance: No Friable: No Rash: No Periwound Skin Moist: Yes  N/A N/A Moisture: Maceration: No Dry/Scaly: No Periwound Skin Color: Atrophie Blanche: No N/A N/A Cyanosis: No Ecchymosis: No Erythema: No Hemosiderin Staining: No Mottled: No Pallor: No Rubor: No Temperature: No Abnormality N/A N/A Tenderness on Yes N/A N/A Palpation: Wound Preparation: Ulcer Cleansing: N/A N/A Rinsed/Irrigated with Saline Topical Anesthetic Applied: Other: lidocaine 4% Assessment Notes: dressing is sticking to peri- N/A N/A wound area. Treatment Notes Electronic Signature(s) Signed: 08/28/2015 6:09:37 PM By: Gretta Cool, RN, BSN, Kim RN, BSN Entered By: Gretta Cool, RN, BSN, Kim on 08/28/2015 09:09:41 [name] ([id number]) -------------------------------------------------------------------------------- [place] Plan Details Patient Name: KAPRICE, KAGE. Date of Service: 08/28/2015 8:45 AM Medical Record Number: [id number] Patient Account Number: [id number] Date of Birth/Sex: 1966-03-25 (49 y.o. Female) Treating RN: Cornell Barman Primary Care Physician: Briscoe Deutscher Other Clinician: Referring Physician: Briscoe Deutscher Treating Physician/Extender: Frann Rider in Treatment: 5 Active Inactive Necrotic Tissue Nursing Diagnoses: Impaired tissue integrity related to necrotic/devitalized tissue Goals: Necrotic/devitalized tissue will be minimized in the wound  bed Date Initiated: 07/24/2015 Goal Status: Active Patient/caregiver will verbalize understanding of reason and process for debridement of necrotic tissue Date Initiated: 07/24/2015 Goal Status: Active Interventions: Assess patient pain level pre-, during and post procedure and prior to discharge Provide education on necrotic tissue and debridement process Treatment Activities: Apply topical anesthetic as ordered : 08/28/2015 Notes: Orientation to the Wound Care Program Nursing Diagnoses: Knowledge deficit related to the wound healing center program Goals: Patient/caregiver will verbalize understanding of the [place] Program Date Initiated: 07/24/2015 Goal Status: Active Interventions: Provide education on orientation to the wound center Notes: LAINA, GUERRIERI. ([id number]) Wound/Skin Impairment Nursing Diagnoses: Impaired tissue integrity Goals: Ulcer/skin breakdown will have a volume reduction of 30% by week 4 Date Initiated: 07/24/2015 Goal Status: Active Ulcer/skin breakdown will have a volume reduction of 50% by week 8 Date Initiated: 07/24/2015 Goal Status: Active Ulcer/skin breakdown will have a volume reduction of 80% by week 12 Date Initiated: 07/24/2015 Goal Status: Active Ulcer/skin breakdown will heal within 14 weeks Date Initiated: 07/24/2015 Goal Status: Active Interventions: Assess patient/caregiver ability to obtain necessary supplies Assess ulceration(s) every visit Notes: Electronic Signature(s) Signed: 08/28/2015 6:09:37 PM By: Gretta Cool, RN, BSN, Kim RN, BSN Entered By: Gretta Cool, RN, BSN, Kim on 08/28/2015 09:07:40 Marcano, Khaniya E. ([id number]) -------------------------------------------------------------------------------- Pain Assessment Details Patient Name: [name] Date of Service: 08/28/2015 8:45 AM Medical Record Number: [id number] Patient Account Number: [id number] Date of Birth/Sex: Apr 01, 1966 (49 y.o. Female) Treating RN:  Cornell Barman Primary Care Physician: Briscoe Deutscher Other Clinician: Referring Physician: Briscoe Deutscher Treating Physician/Extender: Frann Rider in Treatment: 5 Active Problems Location of Pain Severity and Description of Pain Patient Has Paino Yes Site Locations Pain Location: Pain in Ulcers Rate the pain. Current Pain Level: 1 Worst Pain Level: 2 Pain Management and Medication Current Pain Management: Notes little twinge here and there. no longer taking pain medication Electronic Signature(s) Signed: 08/28/2015 6:09:37 PM By: Gretta Cool, RN, BSN, Kim RN, BSN Entered By: Gretta Cool, RN, BSN, Kim on 08/28/2015 08:58:04 [name] ([id number]) -------------------------------------------------------------------------------- Patient/Caregiver Education Details Patient Name: [name]. Date of Service: 08/28/2015 8:45 AM Medical Record Number: [id number] Patient Account Number: [id number] Date of Birth/Gender: October 04, 1966 (49 y.o. Female) Treating RN: Cornell Barman Primary Care Physician: Briscoe Deutscher Other Clinician: Referring Physician: Briscoe Deutscher Treating Physician/Extender: Frann Rider in Treatment: 5 Education Assessment Education Provided To: Patient Education Topics Provided Wound/Skin Impairment: Handouts: Caring for Your Ulcer, Skin Care Do's and Dont's, Other: cpontinue wound care  as prescribed Methods: Demonstration, Explain/Verbal Responses: State content correctly Electronic Signature(s) Signed: 08/28/2015 6:09:37 PM By: Gretta Cool, RN, BSN, Kim RN, BSN Entered By: Gretta Cool, RN, BSN, Kim on 08/28/2015 09:24:46 Tully, Edwinna Areola ([id number]) -------------------------------------------------------------------------------- Wound Assessment Details Patient Name: VERLINE, KONG E. Date of Service: 08/28/2015 8:45 AM Medical Record Number: [id number] Patient Account Number: [id number] Date of Birth/Sex: 1966-05-21 (49 y.o. Female) Treating RN:  Cornell Barman Primary Care Physician: Briscoe Deutscher Other Clinician: Referring Physician: Briscoe Deutscher Treating Physician/Extender: Frann Rider in Treatment: 5 Wound Status Wound Number: 1 Primary Etiology: Cellulitis Wound Location: Left Ischium - Lateral Wound Status: Open Wounding Event: Bite Date Acquired: 06/18/2015 Weeks Of Treatment: 5 Clustered Wound: No Wound Measurements Length: (cm) 0.5 Width: (cm) 1.5 Depth: (cm) 0.1 Area: (cm) 0.589 Volume: (cm) 0.059 % Reduction in Area: 96.2% % Reduction in Volume: 98.1% Epithelialization: Small (1-33%) Tunneling: No Undermining: No Wound Description Full Thickness Without Exposed Classification: Support Structures Wound Margin: Flat and Intact Exudate Small Amount: Exudate Type: Serous Exudate Color: amber Foul Odor After Cleansing: No Wound Bed Granulation Amount: Large (67-100%) Exposed Structure Granulation Quality: Red Fascia Exposed: No Necrotic Amount: Small (1-33%) Fat Layer Exposed: No Necrotic Quality: Eschar Tendon Exposed: No Muscle Exposed: No Joint Exposed: No Bone Exposed: No Limited to Skin Breakdown Periwound Skin Texture Texture Color No Abnormalities Noted: No No Abnormalities Noted: No Callus: No Atrophie Blanche: No Crepitus: No Cyanosis: No Kolar, Eevie E. ([id number]) Excoriation: No Ecchymosis: No Fluctuance: No Erythema: No Friable: No Hemosiderin Staining: No Induration: No Mottled: No Localized Edema: No Pallor: No Rash: No Rubor: No Scarring: Yes Temperature / Pain Moisture Temperature: No Abnormality No Abnormalities Noted: No Tenderness on Palpation: Yes Dry / Scaly: No Maceration: No Moist: Yes Wound Preparation Ulcer Cleansing: Rinsed/Irrigated with Saline Topical Anesthetic Applied: Other: lidocaine 4%, Assessment Notes dressing is sticking to peri-wound area. Treatment Notes Wound #1 (Left, Lateral Ischium) 1. Cleansed with: Clean wound  with Normal Saline 2. Anesthetic Topical Lidocaine 4% cream to wound bed prior to debridement 4. Dressing Applied: Prisma Ag 5. Secondary Dressing Applied ABD Pad 7. Secured with Microbiologist) Signed: 08/28/2015 6:09:37 PM By: Gretta Cool, RN, BSN, Kim RN, BSN Entered By: Gretta Cool, RN, BSN, Kim on 08/28/2015 09:07:32 [place], Edwinna Areola ([id number]) -------------------------------------------------------------------------------- Vitals Details Patient Name: CARLON, DAVIDSON. Date of Service: 08/28/2015 8:45 AM Medical Record Number: [id number] Patient Account Number: [id number] Date of Birth/Sex: 10-07-1966 (49 y.o. Female) Treating RN: Cornell Barman Primary Care Physician: Briscoe Deutscher Other Clinician: Referring Physician: Briscoe Deutscher Treating Physician/Extender: Frann Rider in Treatment: 5 Vital Signs Time Taken: 08:58 Temperature (F): 98.5 Height (in): 65 Pulse (bpm): 77 Weight (lbs): 168 Respiratory Rate (breaths/min): 16 Body Mass Index (BMI): 28 Blood Pressure (mmHg): 129/63 Reference Range: 80 - 120 mg / dl Electronic Signature(s) Signed: 08/28/2015 6:09:37 PM By: Gretta Cool, RN, BSN, Kim RN, BSN Entered By: Gretta Cool, RN, BSN, Kim on 08/28/2015 08:58:31

## 2015-09-11 ENCOUNTER — Encounter: Payer: Self-pay | Admitting: Surgery

## 2015-09-11 NOTE — Progress Notes (Addendum)
[name] ([id number]) Visit Report for 09/11/2015 Chief Complaint Document Details Patient Name: [name], [name]. Date of Service: 09/11/2015 8:45 AM Medical Record Number: [id number] Patient Account Number: [id number] Date of Birth/Sex: 1966-04-27 (49 y.o. Female) Treating RN: Montey Hora Primary Care Physician: Briscoe Deutscher Other Clinician: Referring Physician: Briscoe Deutscher Treating Physician/Extender: Frann Rider in Treatment: 7 Information Obtained from: Patient Chief Complaint Patient presents to the wound care center for a consult due non healing wound. She has had a wound on her left upper thigh and buttock region for about a month. Electronic Signature(s) Signed: 09/11/2015 8:44:10 AM By: Christin Fudge MD, FACS Entered By: Christin Fudge on 09/11/2015 08:44:10 Hass, Edwinna Areola ([id number]) -------------------------------------------------------------------------------- HPI Details Patient Name: [name], [name] Date of Service: 09/11/2015 8:45 AM Medical Record Number: [id number] Patient Account Number: [id number] Date of Birth/Sex: 14-Oct-1966 (49 y.o. Female) Treating RN: Montey Hora Primary Care Physician: Briscoe Deutscher Other Clinician: Referring Physician: Briscoe Deutscher Treating Physician/Extender: Frann Rider in Treatment: 7 History of Present Illness Location: left lateral upper thigh and hip region Quality: Patient reports experiencing a dull pain to affected area(s). Severity: Patient states wound are getting worse. Duration: Patient has had the wound for < 4 weeks prior to presenting for treatment Timing: Pain in wound is Intermittent (comes and goes Context: The wound occurred when the patient possibly had a insect bite to the left hip and buttock region Modifying Factors: Other treatment(s) tried include: local care was done and recently was put on doxycycline for this. Associated Signs and Symptoms: Wound has  purlulent drainage HPI Description: She has no comorbidities and was initially thought to have a insect bite which gradually caused a large purulent abscess with drainage. Due to lack of insurance she did not seek any medical help but continued over-the-counter remedies for this. She was recently seen by her PCP Dr. Briscoe Deutscher who reviewed her wound and besides putting on doxycycline was sent to the wound center. 08/07/2015 -- her wound is doing fine and she has some issues with the dressing sticking to her skin and difficult to remove it. Other than that she is doing fine. 08/28/2015 -- she has been doing a dressing on every third day with silver collagen. No fresh issues. Electronic Signature(s) Signed: 09/11/2015 8:44:14 AM By: Christin Fudge MD, FACS Entered By: Christin Fudge on 09/11/2015 08:44:14 [name] ([id number]) -------------------------------------------------------------------------------- Physical Exam Details Patient Name: [name], [name] Date of Service: 09/11/2015 8:45 AM Medical Record Number: [id number] Patient Account Number: [id number] Date of Birth/Sex: 05/24/1966 (49 y.o. Female) Treating RN: Montey Hora Primary Care Physician: Briscoe Deutscher Other Clinician: Referring Physician: Briscoe Deutscher Treating Physician/Extender: Frann Rider in Treatment: 7 Constitutional . Pulse regular. Respirations normal and unlabored. Afebrile. . Eyes Nonicteric. Reactive to light. Ears, Nose, Mouth, and Throat Lips, teeth, and gums WNL.Marland Kitchen Moist mucosa without lesions . Neck supple and nontender. No palpable supraclavicular or cervical adenopathy. Normal sized without goiter. Respiratory WNL. No retractions.. Cardiovascular Pedal Pulses WNL. No clubbing, cyanosis or edema. Lymphatic No adneopathy. No adenopathy. No adenopathy. Musculoskeletal Adexa without tenderness or enlargement.. Digits and nails w/o clubbing, cyanosis, infection,  petechiae, ischemia, or inflammatory conditions.. Integumentary (Hair, Skin) No suspicious lesions. No crepitus or fluctuance. No peri-wound warmth or erythema. No masses.Marland Kitchen Psychiatric Judgement and insight Intact.. No evidence of depression, anxiety, or agitation.. Notes except for a small area in the center of the wound all of it has healed and there is healthy epithelization. Electronic Signature(s) Signed: 09/11/2015  9:26:38 AM By: Christin Fudge MD, FACS Entered By: Christin Fudge on 09/11/2015 09:26:37 [name] ([id number]) -------------------------------------------------------------------------------- Physician Orders Details Patient Name: [name]. Date of Service: 09/11/2015 8:45 AM Medical Record Number: [id number] Patient Account Number: [id number] Date of Birth/Sex: Apr 22, 1966 (49 y.o. Female) Treating RN: Montey Hora Primary Care Physician: Briscoe Deutscher Other Clinician: Referring Physician: Briscoe Deutscher Treating Physician/Extender: Frann Rider in Treatment: 7 Verbal / Phone Orders: Yes Clinician: Montey Hora Read Back and Verified: Yes Diagnosis Coding ICD-10 Coding Code Description [phone] Non-pressure chronic ulcer of buttock with fat layer exposed S70.362A Insect bite (nonvenomous), left thigh, initial encounter Wound Cleansing Wound #1 Left,Lateral Ischium o Clean wound with Normal Saline. Anesthetic Wound #1 Left,Lateral Ischium o Topical Lidocaine 4% cream applied to wound bed prior to debridement Primary Wound Dressing Wound #1 Left,Lateral Ischium o Prisma Ag Secondary Dressing Wound #1 Left,Lateral Ischium o ABD pad Dressing Change Frequency Wound #1 Left,Lateral Ischium o Dressing is to be changed Monday and Thursday. Follow-up Appointments Wound #1 Left,Lateral Ischium o Return Appointment in 2 weeks. Electronic Signature(s) Signed: 09/11/2015 4:25:19 PM By: Christin Fudge MD, FACS Signed:  09/11/2015 5:18:22 PM By: Montey Hora Entered By: Montey Hora on 09/11/2015 09:22:24 EMONY, DORMER ([id number]) Mccarter, Beau EMarland Kitchen ([id number]) -------------------------------------------------------------------------------- Problem List Details Patient Name: DUCH, Kenniyah E. Date of Service: 09/11/2015 8:45 AM Medical Record Number: [id number] Patient Account Number: [id number] Date of Birth/Sex: 11/19/1965 (49 y.o. Female) Treating RN: Montey Hora Primary Care Physician: Briscoe Deutscher Other Clinician: Referring Physician: Briscoe Deutscher Treating Physician/Extender: Frann Rider in Treatment: 7 Active Problems ICD-10 Encounter Code Description Active Date Diagnosis L98.412 Non-pressure chronic ulcer of buttock with fat layer 07/24/2015 Yes exposed S70.362A Insect bite (nonvenomous), left thigh, initial encounter 07/24/2015 Yes Inactive Problems Resolved Problems ICD-10 Code Description Active Date Resolved Date L03.317 Cellulitis of buttock 07/24/2015 07/24/2015 Electronic Signature(s) Signed: 09/11/2015 8:44:04 AM By: Christin Fudge MD, FACS Entered By: Christin Fudge on 09/11/2015 08:44:04 Deford, Eraina E. ([id number]) -------------------------------------------------------------------------------- Progress Note Details Patient Name: [name] Date of Service: 09/11/2015 8:45 AM Medical Record Number: [id number] Patient Account Number: [id number] Date of Birth/Sex: Jan 16, 1966 (49 y.o. Female) Treating RN: Montey Hora Primary Care Physician: Briscoe Deutscher Other Clinician: Referring Physician: Briscoe Deutscher Treating Physician/Extender: Frann Rider in Treatment: 7 Subjective Chief Complaint Information obtained from Patient Patient presents to the wound care center for a consult due non healing wound. She has had a wound on her left upper thigh and buttock region for about a month. History of Present Illness (HPI) The following  HPI elements were documented for the patient's wound: Location: left lateral upper thigh and hip region Quality: Patient reports experiencing a dull pain to affected area(s). Severity: Patient states wound are getting worse. Duration: Patient has had the wound for < 4 weeks prior to presenting for treatment Timing: Pain in wound is Intermittent (comes and goes Context: The wound occurred when the patient possibly had a insect bite to the left hip and buttock region Modifying Factors: Other treatment(s) tried include: local care was done and recently was put on doxycycline for this. Associated Signs and Symptoms: Wound has purlulent drainage She has no comorbidities and was initially thought to have a insect bite which gradually caused a large purulent abscess with drainage. Due to lack of insurance she did not seek any medical help but continued over-the-counter remedies for this. She was recently seen by her PCP Dr. Briscoe Deutscher who reviewed her wound and besides putting on  doxycycline was sent to the wound center. 08/07/2015 -- her wound is doing fine and she has some issues with the dressing sticking to her skin and difficult to remove it. Other than that she is doing fine. 08/28/2015 -- she has been doing a dressing on every third day with silver collagen. No fresh issues. Objective Constitutional Pulse regular. Respirations normal and unlabored. Afebrile. Vitals Time Taken: 9:01 AM, Height: 65 in, Weight: 168 lbs, BMI: 28, Temperature: 98.4 F, Pulse: 72 bpm, Respiratory Rate: 18 breaths/min, Blood Pressure: 117/60 mmHg. Tibbetts, Juliann E. ([id number]) Eyes Nonicteric. Reactive to light. Ears, Nose, Mouth, and Throat Lips, teeth, and gums WNL.Marland Kitchen Moist mucosa without lesions . Neck supple and nontender. No palpable supraclavicular or cervical adenopathy. Normal sized without goiter. Respiratory WNL. No retractions.. Cardiovascular Pedal Pulses WNL. No clubbing, cyanosis or  edema. Lymphatic No adneopathy. No adenopathy. No adenopathy. Musculoskeletal Adexa without tenderness or enlargement.. Digits and nails w/o clubbing, cyanosis, infection, petechiae, ischemia, or inflammatory conditions.Marland Kitchen Psychiatric Judgement and insight Intact.. No evidence of depression, anxiety, or agitation.. General Notes: except for a small area in the center of the wound all of it has healed and there is healthy epithelization. Integumentary (Hair, Skin) No suspicious lesions. No crepitus or fluctuance. No peri-wound warmth or erythema. No masses.. Wound #1 status is Open. Original cause of wound was Bite. The wound is located on the Left,Lateral Ischium. The wound measures 0.9cm length x 0.9cm width x 0.1cm depth; 0.636cm^2 area and 0.064cm^3 volume. The wound is limited to skin breakdown. There is no tunneling or undermining noted. There is a small amount of serous drainage noted. The wound margin is flat and intact. There is large (67-100%) red granulation within the wound bed. There is no necrotic tissue within the wound bed. The periwound skin appearance exhibited: Scarring, Moist. The periwound skin appearance did not exhibit: Callus, Crepitus, Excoriation, Fluctuance, Friable, Induration, Localized Edema, Rash, Dry/Scaly, Maceration, Atrophie Blanche, Cyanosis, Ecchymosis, Hemosiderin Staining, Mottled, Pallor, Rubor, Erythema. Periwound temperature was noted as No Abnormality. The periwound has tenderness on palpation. Assessment Active Problems TIAHNA, CURE E. 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 Anesthetic: Wound #1 Left,Lateral Ischium: Topical Lidocaine 4% cream applied to wound bed prior to debridement Primary Wound Dressing: Wound #1 Left,Lateral  Ischium: Prisma Ag Secondary Dressing: Wound #1 Left,Lateral Ischium: ABD pad Dressing Change Frequency: Wound #1 Left,Lateral Ischium: Dressing is to be changed Monday and Thursday. Follow-up Appointments: Wound #1 Left,Lateral Ischium: Return Appointment in 2 weeks. I have recommended Prisma AG dressings to be changed twice a week and she will come back and see as in 2 weeks' time. Electronic Signature(s) Signed: 09/11/2015 9:26:57 AM By: Christin Fudge MD, FACS Entered By: Christin Fudge on 09/11/2015 09:26:57 Tuff, Edwinna Areola ([id number]) -------------------------------------------------------------------------------- SuperBill Details Patient Name: [name] Date of Service: 09/11/2015 Medical Record Number: [id number] Patient Account Number: [id number] Date of Birth/Sex: 1966-07-22 (49 y.o. Female) Treating RN: Montey Hora Primary Care Physician: Briscoe Deutscher Other Clinician: Referring Physician: Briscoe Deutscher Treating Physician/Extender: Frann Rider in Treatment: 7 Diagnosis Coding ICD-10 Codes Code Description [phone] Non-pressure chronic ulcer of buttock with fat layer exposed S70.362A Insect bite (nonvenomous), left thigh, initial encounter Facility Procedures CPT4 Code: [id number] Description: [phone] - WOUND CARE VISIT-LEV 2 EST PT Modifier: Quantity: 1 Physician Procedures CPT4 Code: [id number] Description: 76734 - WC PHYS  LEVEL 3 - EST PT ICD-10 Description Diagnosis L98.412 Non-pressure chronic ulcer of buttock with fat l S70.362A Insect bite (nonvenomous), left thigh, initial e Modifier: ayer exposed ncounter Quantity: 1 Electronic Signature(s) Signed: 09/11/2015 4:25:19 PM By: Christin Fudge MD, FACS Signed: 09/11/2015 5:18:22 PM By: Montey Hora Previous Signature: 09/11/2015 9:27:08 AM Version By: Christin Fudge MD, FACS Entered By: Montey Hora on 09/11/2015 09:31:08

## 2015-09-12 NOTE — Progress Notes (Addendum)
[name] ([id number]) Visit Report for 09/11/2015 Arrival Information Details Patient Name: [name], [name]. Date of Service: 09/11/2015 8:45 AM Medical Record Number: [id number] Patient Account Number: [id number] Date of Birth/Sex: [birth year] (49 y.o. Female) Treating RN: Montey Hora Primary Care Physician: Briscoe Deutscher Other Clinician: Referring Physician: Briscoe Deutscher Treating Physician/Extender: Frann Rider in Treatment: 7 Visit Information History Since Last Visit Added or deleted any medications: No Patient Arrived: Ambulatory Any new allergies or adverse reactions: No Arrival Time: 09:00 Had a fall or experienced change in No Accompanied By: self activities of daily living that may affect Transfer Assistance: None risk of falls: Patient Identification Verified: Yes Signs or symptoms of abuse/neglect since last No Secondary Verification Process Yes visito Completed: Hospitalized since last visit: No Pain Present Now: No Electronic Signature(s) Signed: 09/11/2015 5:18:22 PM By: Montey Hora Entered By: Montey Hora on 09/11/2015 09:01:00 Mcfann, Rudell E. ([id number]) -------------------------------------------------------------------------------- Clinic Level of Care Assessment Details Patient Name: [name]. Date of Service: 09/11/2015 8:45 AM Medical Record Number: [id number] Patient Account Number: [id number] Date of Birth/Sex: 07-09-1966 (49 y.o. Female) Treating RN: Montey Hora Primary Care Physician: Briscoe Deutscher Other Clinician: Referring Physician: Briscoe Deutscher Treating Physician/Extender: Frann Rider in Treatment: 7 Clinic Level of Care Assessment Items TOOL 4 Quantity Score []  - Use when only an EandM is performed on FOLLOW-UP visit 0 ASSESSMENTS - Nursing Assessment / Reassessment X - Reassessment of Co-morbidities (includes updates in patient status) 1 10 X - Reassessment of Adherence to Treatment  Plan 1 5 ASSESSMENTS - Wound and Skin Assessment / Reassessment X - Simple Wound Assessment / Reassessment - one wound 1 5 []  - Complex Wound Assessment / Reassessment - multiple wounds 0 []  - Dermatologic / Skin Assessment (not related to wound area) 0 ASSESSMENTS - Focused Assessment []  - Circumferential Edema Measurements - multi extremities 0 []  - Nutritional Assessment / Counseling / Intervention 0 []  - Lower Extremity Assessment (monofilament, tuning fork, pulses) 0 []  - Peripheral Arterial Disease Assessment (using hand held doppler) 0 ASSESSMENTS - Ostomy and/or Continence Assessment and Care []  - Incontinence Assessment and Management 0 []  - Ostomy Care Assessment and Management (repouching, etc.) 0 PROCESS - Coordination of Care X - Simple Patient / Family Education for ongoing care 1 15 []  - Complex (extensive) Patient / Family Education for ongoing care 0 []  - Staff obtains Programmer, systems, Records, Test Results / Process Orders 0 []  - Staff telephones HHA, Nursing Homes / Clarify orders / etc 0 []  - Routine Transfer to another Facility (non-emergent condition) 0 Ditommaso, Annaliese E. ([id number]) []  - Routine Hospital Admission (non-emergent condition) 0 []  - New Admissions / Biomedical engineer / Ordering NPWT, Apligraf, etc. 0 []  - Emergency Hospital Admission (emergent condition) 0 X - Simple Discharge Coordination 1 10 []  - Complex (extensive) Discharge Coordination 0 PROCESS - Special Needs []  - Pediatric / Minor Patient Management 0 []  - Isolation Patient Management 0 []  - Hearing / Language / Visual special needs 0 []  - Assessment of Community assistance (transportation, D/C planning, etc.) 0 []  - Additional assistance / Altered mentation 0 []  - Support Surface(s) Assessment (bed, cushion, seat, etc.) 0 INTERVENTIONS - Wound Cleansing / Measurement X - Simple Wound Cleansing - one wound 1 5 []  - Complex Wound Cleansing - multiple wounds 0 X - Wound Imaging  (photographs - any number of wounds) 1 5 []  - Wound Tracing (instead of photographs) 0 X - Simple Wound Measurement - one wound 1 5 []  -  Complex Wound Measurement - multiple wounds 0 INTERVENTIONS - Wound Dressings X - Small Wound Dressing one or multiple wounds 1 10 []  - Medium Wound Dressing one or multiple wounds 0 []  - Large Wound Dressing one or multiple wounds 0 []  - Application of Medications - topical 0 []  - Application of Medications - injection 0 INTERVENTIONS - Miscellaneous []  - External ear exam 0 Pilley, Zamya E. ([id number]) []  - Specimen Collection (cultures, biopsies, blood, body fluids, etc.) 0 []  - Specimen(s) / Culture(s) sent or taken to Lab for analysis 0 []  - Patient Transfer (multiple staff / Harrel Lemon Lift / Similar devices) 0 []  - Simple Staple / Suture removal (25 or less) 0 []  - Complex Staple / Suture removal (26 or more) 0 []  - Hypo / Hyperglycemic Management (close monitor of Blood Glucose) 0 []  - Ankle / Brachial Index (ABI) - do not check if billed separately 0 X - Vital Signs 1 5 Has the patient been seen at the hospital within the last three years: Yes Total Score: 75 Level Of Care: New/Established - Level 2 Electronic Signature(s) Signed: 09/11/2015 5:18:22 PM By: Montey Hora Entered By: Montey Hora on 09/11/2015 09:30:57 [name] ([id number]) -------------------------------------------------------------------------------- Encounter Discharge Information Details Patient Name: [name]. Date of Service: 09/11/2015 8:45 AM Medical Record Number: [id number] Patient Account Number: [id number] Date of Birth/Sex: 1966/04/11 (49 y.o. Female) Treating RN: Montey Hora Primary Care Physician: Briscoe Deutscher Other Clinician: Referring Physician: Briscoe Deutscher Treating Physician/Extender: Frann Rider in Treatment: 7 Encounter Discharge Information Items Discharge Pain Level: 0 Discharge Condition: Stable Ambulatory  Status: Ambulatory Discharge Destination: Home Transportation: Private Auto Accompanied By: self Schedule Follow-up Appointment: Yes Medication Reconciliation completed and provided to Patient/Care No Janice Reaser: Provided on Clinical Summary of Care: 09/11/2015 Form Type Recipient Paper Patient [facility] Electronic Signature(s) Signed: 09/11/2015 5:18:22 PM By: Montey Hora Previous Signature: 09/11/2015 9:30:21 AM Version By: Ruthine Dose Entered By: Montey Hora on 09/11/2015 09:31:47 Amano, Candas E. ([id number]) -------------------------------------------------------------------------------- Multi Wound Chart Details Patient Name: [name] Date of Service: 09/11/2015 8:45 AM Medical Record Number: [id number] Patient Account Number: [id number] Date of Birth/Sex: [birth year] (49 y.o. Female) Treating RN: Montey Hora Primary Care Physician: Briscoe Deutscher Other Clinician: Referring Physician: Briscoe Deutscher Treating Physician/Extender: Frann Rider in Treatment: 7 Vital Signs Height(in): 65 Pulse(bpm): 72 Weight(lbs): 168 Blood Pressure 117/60 (mmHg): Body Mass Index(BMI): 28 Temperature(F): 98.4 Respiratory Rate 18 (breaths/min): Photos: [1:No Photos] [N/A:N/A] Wound Location: [1:Left Ischium - Lateral] [N/A:N/A] Wounding Event: [1:Bite] [N/A:N/A] Primary Etiology: [1:Cellulitis] [N/A:N/A] Date Acquired: [1:06/18/2015] [N/A:N/A] Weeks of Treatment: [1:7] [N/A:N/A] Wound Status: [1:Open] [N/A:N/A] Measurements L x W x D 0.9x0.9x0.1 [N/A:N/A] (cm) Area (cm) : [1:0.636] [N/A:N/A] Volume (cm) : [1:0.064] [N/A:N/A] % Reduction in Area: [1:95.90%] [N/A:N/A] % Reduction in Volume: 98.00% [N/A:N/A] Classification: [1:Full Thickness Without Exposed Support Structures] [N/A:N/A] Exudate Amount: [1:Small] [N/A:N/A] Exudate Type: [1:Serous] [N/A:N/A] Exudate Color: [1:amber] [N/A:N/A] Wound Margin: [1:Flat and Intact] [N/A:N/A] Granulation Amount:  [1:Large (67-100%)] [N/A:N/A] Granulation Quality: [1:Red] [N/A:N/A] Necrotic Amount: [1:None Present (0%)] [N/A:N/A] Exposed Structures: [1:Fascia: No Fat: No Tendon: No Muscle: No Joint: No Bone: No] [N/A:N/A] Limited to Skin Breakdown Epithelialization: Small (1-33%) N/A N/A Periwound Skin Texture: Scarring: Yes N/A N/A Edema: No Excoriation: No Induration: No Callus: No Crepitus: No Fluctuance: No Friable: No Rash: No Periwound Skin Moist: Yes N/A N/A Moisture: Maceration: No Dry/Scaly: No Periwound Skin Color: Atrophie Blanche: No N/A N/A Cyanosis: No Ecchymosis: No Erythema: No Hemosiderin Staining: No Mottled: No Pallor: No  Rubor: No Temperature: No Abnormality N/A N/A Tenderness on Yes N/A N/A Palpation: Wound Preparation: Ulcer Cleansing: N/A N/A Rinsed/Irrigated with Saline Topical Anesthetic Applied: Other: lidocaine 4% Treatment Notes Electronic Signature(s) Signed: 09/11/2015 5:18:22 PM By: Montey Hora Entered By: Montey Hora on 09/11/2015 09:22:04 [name] ([id number]) -------------------------------------------------------------------------------- [place] Details Patient Name: STEPHONIE, WILCOXEN. Date of Service: 09/11/2015 8:45 AM Medical Record Number: [id number] Patient Account Number: [id number] Date of Birth/Sex: 05-Feb-1966 (49 y.o. Female) Treating RN: Montey Hora Primary Care Physician: Briscoe Deutscher Other Clinician: Referring Physician: Briscoe Deutscher Treating Physician/Extender: Frann Rider in Treatment: 7 Active Inactive Electronic Signature(s) Signed: 10/09/2015 5:17:25 PM By: Gretta Cool RN, BSN, Kim RN, BSN Signed: 11/23/2015 5:07:02 PM By: Montey Hora Previous Signature: 09/11/2015 5:18:22 PM Version By: Montey Hora Entered By: Gretta Cool RN, BSN, Kim on 10/09/2015 12:48:03 Wesely, Edwinna Areola  ([id number]) -------------------------------------------------------------------------------- Patient/Caregiver Education Details Patient Name: JAMARIS, BIERNAT. Date of Service: 09/11/2015 8:45 AM Medical Record Number: [id number] Patient Account Number: [id number] Date of Birth/Gender: 1966/04/24 (49 y.o. Female) Treating RN: Montey Hora Primary Care Physician: Briscoe Deutscher Other Clinician: Referring Physician: Briscoe Deutscher Treating Physician/Extender: Frann Rider in Treatment: 7 Education Assessment Education Provided To: Patient Education Topics Provided Wound/Skin Impairment: Handouts: Other: wound care as ordered Methods: Demonstration, Explain/Verbal Responses: State content correctly Electronic Signature(s) Signed: 09/11/2015 5:18:22 PM By: Montey Hora Entered By: Montey Hora on 09/11/2015 09:32:04 Maione, Hannelore E. ([id number]) -------------------------------------------------------------------------------- Wound Assessment Details Patient Name: Niederer, Melitza E. Date of Service: 09/11/2015 8:45 AM Medical Record Number: [id number] Patient Account Number: [id number] Date of Birth/Sex: [birth year] (49 y.o. Female) Treating RN: Montey Hora Primary Care Physician: Briscoe Deutscher Other Clinician: Referring Physician: Briscoe Deutscher Treating Physician/Extender: Frann Rider in Treatment: 7 Wound Status Wound Number: 1 Primary Etiology: Cellulitis Wound Location: Left Ischium - Lateral Wound Status: Healed - Epithelialized Wounding Event: Bite Date Acquired: 06/18/2015 Weeks Of Treatment: 7 Clustered Wound: No Photos Wound Measurements Length: (cm) 0 % Reduction i Width: (cm) 0 % Reduction i Depth: (cm) 0 Epithelializa Area: (cm) 0 Tunneling: Volume: (cm) 0 Undermining: n Area: 100% n Volume: 100% tion: Large (67-100%) No No Wound Description Full Thickness Without Exposed Foul Odor Aft Classification: Support  Structures Wound Margin: Flat and Intact Exudate Small Amount: Exudate Type: Serous Exudate Color: amber er Cleansing: No Wound Bed Granulation Amount: Large (67-100%) Exposed Structure Granulation Quality: Red Fascia Exposed: No Necrotic Amount: None Present (0%) Fat Layer Exposed: No Mcclean, Arnetha E. ([id number]) Tendon Exposed: No Muscle Exposed: No Joint Exposed: No Bone Exposed: No Limited to Skin Breakdown Periwound Skin Texture Texture Color No Abnormalities Noted: No No Abnormalities Noted: No Callus: No Atrophie Blanche: No Crepitus: No Cyanosis: No Excoriation: No Ecchymosis: No Fluctuance: No Erythema: No Friable: No Hemosiderin Staining: No Induration: No Mottled: No Localized Edema: No Pallor: No Rash: No Rubor: No Scarring: Yes Temperature / Pain Moisture Temperature: No Abnormality No Abnormalities Noted: No Tenderness on Palpation: Yes Dry / Scaly: No Maceration: No Moist: Yes Wound Preparation Ulcer Cleansing: Rinsed/Irrigated with Saline Topical Anesthetic Applied: Other: lidocaine 4%, Treatment Notes Wound #1 (Left, Lateral Ischium) 1. Cleansed with: Clean wound with Normal Saline 2. Anesthetic Topical Lidocaine 4% cream to wound bed prior to debridement 4. Dressing Applied: Prisma Ag 5. Secondary Dressing Applied ABD Pad 7. Secured with Recruitment consultant) Signed: 11/02/2015 6:22:50 PM By: Gretta Cool RN, BSN, Kim RN, BSN Signed: 11/23/2015 5:07:02 PM By: Montey Hora Previous Signature: 09/11/2015 5:18:22 PM Version By: Montey Hora Entered  By: Gretta Cool, RN, BSN, Kim on 10/19/2015 08:55:23 Burnham, Edwinna Areola ([id number]) Nadara Mustard, Edwinna Areola ([id number]) -------------------------------------------------------------------------------- Vitals Details Patient Name: [name]. Date of Service: 09/11/2015 8:45 AM Medical Record Number: [id number] Patient Account Number: [id number] Date of Birth/Sex: Mar 26, 1966 (49  y.o. Female) Treating RN: Montey Hora Primary Care Physician: Briscoe Deutscher Other Clinician: Referring Physician: Briscoe Deutscher Treating Physician/Extender: Frann Rider in Treatment: 7 Vital Signs Time Taken: 09:01 Temperature (F): 98.4 Height (in): 65 Pulse (bpm): 72 Weight (lbs): 168 Respiratory Rate (breaths/min): 18 Body Mass Index (BMI): 28 Blood Pressure (mmHg): 117/60 Reference Range: 80 - 120 mg / dl Electronic Signature(s) Signed: 09/11/2015 5:18:22 PM By: Montey Hora Entered By: Montey Hora on 09/11/2015 09:03:03

## 2015-09-25 ENCOUNTER — Ambulatory Visit: Payer: Self-pay | Admitting: Surgery

## 2016-01-10 ENCOUNTER — Ambulatory Visit: Payer: Self-pay

## 2016-01-15 ENCOUNTER — Ambulatory Visit
Admission: RE | Admit: 2016-01-15 | Discharge: 2016-01-15 | Disposition: A | Discharge status: Home / Self Care | Payer: Self-pay | Source: Ambulatory Visit | Attending: Oncology | Admitting: Oncology

## 2016-01-15 ENCOUNTER — Encounter: Payer: Self-pay | Admitting: *Deleted

## 2016-01-15 ENCOUNTER — Ambulatory Visit: Payer: Self-pay | Attending: Oncology | Admitting: *Deleted

## 2016-01-15 VITALS — BP 134/71 | HR 74 | Temp 98.4°F | Ht 65.75 in | Wt 173.1 lb

## 2016-01-15 DIAGNOSIS — Z0000 Encounter for general adult medical examination without abnormal findings: Secondary | ICD-10-CM

## 2016-01-15 NOTE — Progress Notes (Signed)
Subjective:     Patient ID: [name], female   DOB: November 29, 1965, 50 y.o.   MRN: [id number]  HPI   Review of Systems     Objective:   Physical Exam  Pulmonary/Chest: Right breast exhibits no inverted nipple, no mass, no nipple discharge, no skin change and no tenderness. Left breast exhibits no inverted nipple, no mass, no nipple discharge, no skin change and no tenderness. Breasts are symmetrical.       Assessment:     50 year old White female returns to [facility] for annual exam.  Clinical breast exam unremarkable.  Taught self breast awareness.  Patient has been screened for eligibility.  She does not have any insurance, Medicare or Medicaid.  She also meets financial eligibility.  Hand-out given on the Affordable Care Act.             Plan:     Screening mammogram ordered.  Will follow-up per [facility] protocol.

## 2016-01-15 NOTE — Patient Instructions (Signed)
Gave patient hand-out, Women Staying Healthy, Active and Well from [facility], with education on breast health, pap smears, heart and colon health. 

## 2016-02-02 ENCOUNTER — Encounter: Payer: Self-pay | Admitting: *Deleted

## 2016-02-02 NOTE — Progress Notes (Signed)
Letter mailed from [facility] to inform patient of her normal mammogram results.  Patient is to follow-up with annual screening in one year.  HSIS to Christy. 

## 2017-01-13 ENCOUNTER — Telehealth: Payer: Self-pay | Admitting: Certified Nurse Midwife

## 2017-01-13 NOTE — Telephone Encounter (Signed)
Pt is calling due to Missed call last week about her lab results. Pt states you are able to leave an detail message on voicemail if you aren't able to reach patient. Cb#[phone]

## 2017-01-15 NOTE — Telephone Encounter (Signed)
Left msg for pt with this information and to call and schedule breast exam.

## 2017-01-15 NOTE — Telephone Encounter (Signed)
I called this patient to find out more about the breast problem she was having. There is a task in [place]. I have no results for her. Have not seen her in a year. Need to do breast exam before I send her for a diagnostic mammogram

## 2017-02-14 ENCOUNTER — Telehealth: Payer: Self-pay

## 2017-02-14 NOTE — Telephone Encounter (Signed)
Tried to call patient and line was busy.

## 2017-02-14 NOTE — Telephone Encounter (Signed)
Pt calling for CLG.  Calling to resolve this issue of huge confusion.  pls call [phone]

## 2017-02-18 NOTE — Telephone Encounter (Signed)
Update from previous telephone note in chart, pt needs breast exam prior to being scheduled for diagnostic mammo. Pt aware and transferred to front desk for scheduling.

## 2017-02-25 NOTE — Telephone Encounter (Signed)
Patient has never called back. Will complete message

## 2017-03-07 ENCOUNTER — Ambulatory Visit: Payer: Self-pay | Admitting: Certified Nurse Midwife

## 2017-03-28 ENCOUNTER — Ambulatory Visit: Payer: Self-pay | Admitting: Certified Nurse Midwife

## 2017-07-09 ENCOUNTER — Ambulatory Visit
Admission: RE | Admit: 2017-07-09 | Discharge: 2017-07-09 | Disposition: A | Discharge status: Home / Self Care | Payer: Self-pay | Source: Ambulatory Visit | Attending: Oncology | Admitting: Oncology

## 2017-07-09 ENCOUNTER — Encounter: Payer: Self-pay | Admitting: *Deleted

## 2017-07-09 ENCOUNTER — Ambulatory Visit: Payer: Self-pay | Attending: Oncology | Admitting: *Deleted

## 2017-07-09 VITALS — BP 131/79 | HR 81 | Temp 98.7°F | Ht 67.0 in | Wt 172.0 lb

## 2017-07-09 DIAGNOSIS — Z0000 Encounter for general adult medical examination without abnormal findings: Secondary | ICD-10-CM

## 2017-07-09 NOTE — Progress Notes (Signed)
Subjective:     Patient ID: [name], female   DOB: 1966/02/06, 51 y.o.   MRN: [id number]  HPI   Review of Systems     Objective:   Physical Exam  Pulmonary/Chest: Right breast exhibits no inverted nipple, no mass, no nipple discharge, no skin change and no tenderness. Left breast exhibits no inverted nipple, no mass, no nipple discharge, no skin change and no tenderness. Breasts are symmetrical.       Assessment:     51 year old White female returns to [facility] for annual exam.  Clinical breast exam unremarkable.  Taught self breast awareness.  Last pap at [facility] on 04/09/16 was negative / negative.  Next pap due in 2022 unless otherwise indicated by her GYN.  Patient complains of hot flashes and night sweats. [place] Patient has been screened for eligibility.  She does not have any insurance, Medicare or Medicaid.  She also meets financial eligibility.  Hand-out given on the Affordable Care Act.    Plan:     Screening mammogram ordered.  Hand out on menopause given.  Will follow-up per [facility] protocol.

## 2017-07-09 NOTE — Patient Instructions (Signed)
Act.Menopause Menopause is the normal time of life when menstrual periods stop completely. Menopause is complete when you have missed 12 consecutive menstrual periods. It usually occurs between the ages of 50 years and 48 years. Very rarely does a woman develop menopause before the age of 50 years. At menopause, your ovaries stop producing the female hormones estrogen and progesterone. This can cause undesirable symptoms and also affect your health. Sometimes the symptoms may occur 4-5 years before the menopause begins. There is no relationship between menopause and:  Oral contraceptives.  Number of children you had.  Race.  The age your menstrual periods started (menarche).  Heavy smokers and very thin women may develop menopause earlier in life. What are the causes?  The ovaries stop producing the female hormones estrogen and progesterone. Other causes include:  Surgery to remove both ovaries.  The ovaries stop functioning for no known reason.  Tumors of the pituitary gland in the brain.  Medical disease that affects the ovaries and hormone production.  Radiation treatment to the abdomen or pelvis.  Chemotherapy that affects the ovaries.  What are the signs or symptoms?  Hot flashes.  Night sweats.  Decrease in sex drive.  Vaginal dryness and thinning of the vagina causing painful intercourse.  Dryness of the skin and developing wrinkles.  Headaches.  Tiredness.  Irritability.  Memory problems.  Weight gain.  Bladder infections.  Hair growth of the face and chest.  Infertility. More serious symptoms include:  Loss of bone (osteoporosis) causing breaks (fractures).  Depression.  Hardening and narrowing of the arteries (atherosclerosis) causing heart attacks and strokes.  How is this diagnosed?  When the menstrual periods have stopped for 12 straight months.  Physical exam.  Hormone studies of the blood. How is this treated? There are many  treatment choices and nearly as many questions about them. The decisions to treat or not to treat menopausal changes is an individual choice made with your health care provider. Your health care provider can discuss the treatments with you. Together, you can decide which treatment will work best for you. Your treatment choices may include:  Hormone therapy (estrogen and progesterone).  Non-hormonal medicines.  Treating the individual symptoms with medicine (for example antidepressants for depression).  Herbal medicines that may help specific symptoms.  Counseling by a psychiatrist or psychologist.  Group therapy.  Lifestyle changes including: ? Eating healthy. ? Regular exercise. ? Limiting caffeine and alcohol. ? Stress management and meditation.  No treatment.  Follow these instructions at home:  Take the medicine your health care provider gives you as directed.  Get plenty of sleep and rest.  Exercise regularly.  Eat a diet that contains calcium (good for the bones) and soy products (acts like estrogen hormone).  Avoid alcoholic beverages.  Do not smoke.  If you have hot flashes, dress in layers.  Take supplements, calcium, and vitamin D to strengthen bones.  You can use over-the-counter lubricants or moisturizers for vaginal dryness.  Group therapy is sometimes very helpful.  Acupuncture may be helpful in some cases. Contact a health care provider if:  You are not sure you are in menopause.  You are having menopausal symptoms and need advice and treatment.  You are still having menstrual periods after age 50 years.  You have pain with intercourse.  Menopause is complete (no menstrual period for 12 months) and you develop vaginal bleeding.  You need a referral to a specialist (gynecologist, psychiatrist, or psychologist) for treatment. Get  help right away if:  You have severe depression.  You have excessive vaginal bleeding.  You fell and think you  have a broken bone.  You have pain when you urinate.  You develop leg or chest pain.  You have a fast pounding heart beat (palpitations).  You have severe headaches.  You develop vision problems.  You feel a lump in your breast.  You have abdominal pain or severe indigestion. This information is not intended to replace advice given to you by your health care provider. Make sure you discuss any questions you have with your health care provider. Document Released: 01/18/2004 Document Revised: 04/04/2016 Document Reviewed: 05/27/2013 Elsevier Interactive Patient Education  2017 [employer].

## 2017-07-18 ENCOUNTER — Encounter: Payer: Self-pay | Admitting: *Deleted

## 2017-07-18 NOTE — Progress Notes (Signed)
Letter mailed from [facility] to inform patient of her normal mammogram results.  Patient is to follow-up with annual screening in one year.  HSIS to Christy. 

## 2018-07-22 ENCOUNTER — Ambulatory Visit: Payer: Self-pay

## 2018-09-21 NOTE — Progress Notes (Signed)
Gynecology Annual Exam  PCP: Dalia Heading, CNM  Chief Complaint:  Chief Complaint  Patient presents with  . Gynecologic Exam    History of Present Illness:[name] is a 52 year old Caucasian/White female, [place], who presents for her annual exam. Her menses are absent due to IUD.  She has not had spotting. Hot flashes have lessened. Has had some dyspareunia more recently. Does not use lubricants.   The patient's past medical history is unremarkable.  Since her last annual GYN exam dated 04/09/16, she has gained 27#. Her husband has also been diagnosed with rectal cancer and will be undergoing chemo and radiation before he has some surgery. One of her brothers has also died at age 39 from hepatic failure.  She is sexually active. She is currently using an IUD for contraception. Mirena IUD was placed on 05/04/2014.  Her most recent pap smear was obtained 04/09/2016 and was NIL with negative HRHPV. Her previous pPap was NIL with positive HRHPV ( no 16, 18, 45).  Her most recent mammogram obtained 07/09/2017 was normal.  There is a positive history of breast cancer in her paternal aunt. Genetic testing was done on the aunt, but unsure as to results. There is no family history of ovarian cancer.  She has not had a colonoscopy and is eligible. The patient does do monthly self breast exams.  The patient does not smoke.  The patient does drink a few glasses a week.  The patient does not use illegal drugs.  The patient exercises regularly, doing yard work..  The patient does not get adequate calcium in her diet.  She has not had a recent cholesterol screen and is not interested in labwork.     Review of Systems: Review of Systems  Constitutional: Negative for chills, fever and weight loss.  HENT: Negative for congestion, sinus pain and sore throat.   Eyes: Negative for blurred vision and pain.  Respiratory: Negative for hemoptysis, shortness of breath and wheezing.     Cardiovascular: Negative for chest pain, palpitations and leg swelling.  Gastrointestinal: Negative for abdominal pain, blood in stool, diarrhea, heartburn, nausea and vomiting.  Genitourinary: Negative for dysuria, frequency, hematuria and urgency.       Positive for dyspareunia  Musculoskeletal: Negative for back pain, joint pain and myalgias.  Skin: Negative for itching and rash.  Neurological: Negative for dizziness, tingling and headaches.  Endo/Heme/Allergies: Negative for environmental allergies and polydipsia. Does not bruise/bleed easily.       Negative for hirsutism   Psychiatric/Behavioral: Negative for depression. The patient is not nervous/anxious and does not have insomnia.     Past Medical History:  Past Medical History:  Diagnosis Date  . Family history of breast cancer 03/24/2014   gen testing done but not sure of results    Past Surgical History:  Past Surgical History:  Procedure Laterality Date  . NO PAST SURGERIES      Family History:  Family History  Problem Relation Age of Onset  . Breast cancer Paternal Aunt 2  . Hyperlipidemia Mother   . Hypertension Mother   . Hyperlipidemia Father   . Hypertension Brother   . Liver disease Brother    OB History  Gravida Para Term Preterm AB Living  2 2 2     2   SAB TAB Ectopic Multiple Live Births          2    # Outcome Date GA Lbr Len/2nd Weight  Sex Delivery Anes PTL Lv  2 Term 01/10/09   5 lb 13 oz (2.637 kg) M Vag-Spont   LIV  1 Term 06/15/00   7 lb 5 oz (3.317 kg) M Vag-Spont   LIV   Social History:  Social History   Socioeconomic History  . Marital status: Married    Spouse name: Not on file  . Number of children: 2  . Years of education: 44  . Highest education level: Not on file  Occupational History  . Occupation: HOMEMAKER  Social Needs  . Financial resource strain: Not on file  . Food insecurity:    Worry: Not on file    Inability: Not on file  . Transportation needs:    Medical:  Not on file    Non-medical: Not on file  Tobacco Use  . Smoking status: Former Smoker    Last attempt to quit: 10/1994    Years since quitting: 23.9  . Smokeless tobacco: Never Used  Substance and Sexual Activity  . Alcohol use: Yes    Comment: occ  . Drug use: Never  . Sexual activity: Yes    Birth control/protection: Other-see comments    Comment: perimenopausal  Lifestyle  . Physical activity:    Days per week: Not on file    Minutes per session: Not on file  . Stress: Not on file  Relationships  . Social connections:    Talks on phone: Not on file    Gets together: Not on file    Attends religious service: Not on file    Active member of club or organization: Not on file    Attends meetings of clubs or organizations: Not on file    Relationship status: Not on file  . Intimate partner violence:    Fear of current or ex partner: Not on file    Emotionally abused: Not on file    Physically abused: Not on file    Forced sexual activity: Not on file  Other Topics Concern  . Not on file  Social History Narrative  . Not on file    Allergies:  Allergies  Allergen Reactions  . Penicillin G Hives  . Sulfamethoxazole-Trimethoprim Rash    Medications: Prior to Admission medications   Not on File    Physical Exam Vitals: BP 120/80   Pulse 72   Ht 5\' 6"  (1.676 m)   Wt 183 lb (83 kg)   LMP  (LMP Unknown)   BMI 29.54 kg/m   General: WF in NAD HEENT: normocephalic, anicteric Neck: no thyroid enlargement, no palpable nodules, no cervical lymphadenopathy  Pulmonary: No increased work of breathing, CTAB Cardiovascular: RRR, without murmur  Breast: Breast symmetrical, no tenderness, no palpable nodules or masses, no skin or nipple retraction present, no nipple discharge. Tiny white blocked [place] duct on left nipple. No axillary, infraclavicular or supraclavicular lymphadenopathy. Abdomen: Soft, non-tender, non-distended.  Umbilicus without lesions.  No  hepatomegaly or masses palpable. No evidence of hernia. Genitourinary:  External: Normal external female genitalia.  Normal urethral meatus, normal  Bartholin's and Skene's glands.    Vagina: Normal vaginal mucosa, no evidence of prolapse.    Cervix: Grossly normal in appearance, no bleeding, non-tender, IUD strings seen and palpated  Uterus: Anteverted, normal size, shape, and consistency, mobile, and non-tender  Adnexa: No adnexal masses, non-tender  Rectal: no masses. Hemoccult negative.  Lymphatic: no evidence of inguinal lymphadenopathy Extremities: no edema, erythema, or tenderness Neurologic: Grossly intact Psychiatric: mood appropriate, affect full  Assessment: 52 y.o. [id number] annual gyn exam Amenorrhea on IUD/ Perimenopausal IUD intact  Plan:   1) Breast cancer screening - recommend monthly self breast exam and annual mammogram. Mammogram was ordered today. Has mammogram scheduled for tomorrow.  2) Colon cancer screening discussed: options regarding colonoscopy, Cologuard, hemoccult testing discussed. Does not have money for colonoscopy or Cologuard. Will do annual hemoccult testing. Hemoccult today was negative.  3) Cervical cancer screening - Pap was done. ASCCP guidelines and rational discussed.  Patient opts for yearly screening interval  4) Contraception - Plan on leaving Mirena in place until next year. Will get FSH/ LH next year.   5) Routine healthcare maintenance including cholesterol and diabetes screening offered: desires to do next year. Offered flu vaccine and declined  6) RTO 1 year and prn  Dalia Heading, CNM

## 2018-09-22 ENCOUNTER — Encounter: Payer: Self-pay | Admitting: Certified Nurse Midwife

## 2018-09-22 ENCOUNTER — Other Ambulatory Visit (HOSPITAL_COMMUNITY)
Admission: RE | Admit: 2018-09-22 | Discharge: 2018-09-22 | Disposition: A | Discharge status: Home / Self Care | Payer: Medicaid Other | Source: Ambulatory Visit | Attending: Certified Nurse Midwife | Admitting: Certified Nurse Midwife

## 2018-09-22 ENCOUNTER — Ambulatory Visit (INDEPENDENT_AMBULATORY_CARE_PROVIDER_SITE_OTHER): Payer: Medicaid Other | Admitting: Certified Nurse Midwife

## 2018-09-22 VITALS — BP 120/80 | HR 72 | Ht 66.0 in | Wt 183.0 lb

## 2018-09-22 DIAGNOSIS — Z1211 Encounter for screening for malignant neoplasm of colon: Secondary | ICD-10-CM

## 2018-09-22 DIAGNOSIS — Z0000 Encounter for general adult medical examination without abnormal findings: Secondary | ICD-10-CM

## 2018-09-22 DIAGNOSIS — Z1239 Encounter for other screening for malignant neoplasm of breast: Secondary | ICD-10-CM

## 2018-09-22 DIAGNOSIS — Z01419 Encounter for gynecological examination (general) (routine) without abnormal findings: Secondary | ICD-10-CM | POA: Diagnosis present

## 2018-09-22 DIAGNOSIS — Z124 Encounter for screening for malignant neoplasm of cervix: Secondary | ICD-10-CM | POA: Insufficient documentation

## 2018-09-22 LAB — HM PAP SMEAR: HM Pap smear: NORMAL

## 2018-09-23 ENCOUNTER — Encounter: Payer: Self-pay | Admitting: *Deleted

## 2018-09-23 ENCOUNTER — Other Ambulatory Visit: Payer: Self-pay

## 2018-09-23 ENCOUNTER — Ambulatory Visit
Admission: RE | Admit: 2018-09-23 | Discharge: 2018-09-23 | Disposition: A | Discharge status: Home / Self Care | Payer: Medicaid Other | Source: Ambulatory Visit | Attending: Oncology | Admitting: Oncology

## 2018-09-23 ENCOUNTER — Ambulatory Visit: Payer: Self-pay | Attending: Oncology | Admitting: *Deleted

## 2018-09-23 ENCOUNTER — Encounter: Payer: Self-pay | Admitting: Certified Nurse Midwife

## 2018-09-23 VITALS — BP 123/78 | HR 76 | Temp 98.0°F | Ht 66.0 in | Wt 188.0 lb

## 2018-09-23 DIAGNOSIS — Z0000 Encounter for general adult medical examination without abnormal findings: Secondary | ICD-10-CM | POA: Insufficient documentation

## 2018-09-23 LAB — HEMOCCULT GUIAC POC 1CARD (OFFICE): Fecal Occult Blood, POC: NEGATIVE

## 2018-09-23 NOTE — Progress Notes (Signed)
  Subjective:     Patient ID: [name], female   DOB: 1966-11-10, 52 y.o.   MRN: [id number]  HPI   Review of Systems     Objective:   Physical Exam  Pulmonary/Chest: Right breast exhibits no inverted nipple, no mass, no nipple discharge, no skin change and no tenderness. Left breast exhibits no inverted nipple, no mass, no nipple discharge, no skin change and no tenderness.       Assessment:     52 year old White female returns to [facility] for annual screening.  Clinical breast exam unremarkable.  Taught self breast awareness.  Patient had her pap smear completed yesterday at [facility].  Results are pending.  Patient has been screened for eligibility.  She does not have any insurance, Medicare or Medicaid.  She also meets financial eligibility.  Hand-out given on the Affordable Care Act. Risk Assessment    Risk Scores      09/23/2018   Last edited by: Theodore Demark, RN   5-year risk: 1.4 %   Lifetime risk: 12 %            Plan:     **Screening mammogram ordered.  Will follow-up per [facility] protocol.

## 2018-09-23 NOTE — Patient Instructions (Signed)
Gave patient hand-out, Women Staying Healthy, Active and Well from [facility], with education on breast health, pap smears, heart and colon health. 

## 2018-09-24 LAB — CYTOLOGY - PAP: Diagnosis: NEGATIVE

## 2018-09-28 ENCOUNTER — Encounter: Payer: Self-pay | Admitting: Obstetrics and Gynecology

## 2018-10-13 ENCOUNTER — Encounter: Payer: Self-pay | Admitting: *Deleted

## 2018-10-13 NOTE — Progress Notes (Signed)
Letter mailed from [facility] to inform patient of her normal mammogram results.  Patient is to follow-up with annual screening in one year.  HSIS to Christy. 

## 2019-09-27 ENCOUNTER — Telehealth: Payer: Self-pay

## 2019-09-27 NOTE — Telephone Encounter (Signed)
Pre-visit [facility] call for appointment on 09/29/2019. No answer / left msg.

## 2019-09-29 ENCOUNTER — Other Ambulatory Visit: Payer: Self-pay

## 2019-09-29 ENCOUNTER — Ambulatory Visit: Payer: Medicaid Other | Attending: Oncology

## 2019-09-29 ENCOUNTER — Ambulatory Visit
Admission: RE | Admit: 2019-09-29 | Discharge: 2019-09-29 | Disposition: A | Discharge status: Home / Self Care | Payer: Medicaid Other | Source: Ambulatory Visit | Attending: Oncology | Admitting: Oncology

## 2019-09-29 VITALS — BP 152/84 | HR 102 | Temp 97.4°F | Resp 16 | Ht 66.5 in | Wt 202.6 lb

## 2019-09-29 DIAGNOSIS — Z0000 Encounter for general adult medical examination without abnormal findings: Secondary | ICD-10-CM | POA: Insufficient documentation

## 2019-09-29 NOTE — Progress Notes (Signed)
  Subjective:     Patient ID: [name], female   DOB: 12/15/1965, 53 y.o.   MRN: [id number]  HPI   Review of Systems     Objective:   Physical Exam Chest:     Breasts:        Right: No swelling, bleeding, inverted nipple, mass, nipple discharge, skin change or tenderness.        Left: No swelling, bleeding, inverted nipple, mass, nipple discharge, skin change or tenderness.        Assessment:     53 year old patient presents for [facility] clinic visit. Patient screened, and meets [facility] eligibility.  Patient does not have insurance, Medicare or Medicaid. Instructed patient on breast self awareness using teach back method.  Clinical breast exam unremarkable.  No mass or lump palpated.  Patient works at [employer], and is president of [employer].  Recheck blood pressure 152/84.    Plan:     Sent for bilateral screening mammogram.

## 2019-10-05 NOTE — Progress Notes (Signed)
Letter mailed from [facility] to notify of normal mammogram results.  Patient to return in one year for annual screening.  Copy to [facility]. 

## 2019-10-12 ENCOUNTER — Ambulatory Visit: Payer: Medicaid Other | Admitting: Certified Nurse Midwife

## 2019-11-15 NOTE — Progress Notes (Signed)
Gynecology Annual Exam  PCP: Dalia Heading, CNM  Chief Complaint:  Chief Complaint  Patient presents with  . Annual Exam    elevated BP, recheck 156/90. pt states it was high at her mammo appt in 09/2019    History of Present Illness:Hether E. Eccher is a 54 year old Caucasian/White female, [place], who presents for her annual exam. Her menses are absent due to IUD.  She has not had spotting. Hot flashes have lessened.   The patient's past medical history is unremarkable.  Since her last annual GYN exam dated 09/22/2018, she has gained 18#. (gained 45# in the last 2 years). Her husband has rectal cancer and has undergone chemo and radiation as well as surgery. One of her brothers has also died at age 36 from hepatic failure.  She is sexually active. She is currently using an IUD for contraception. Mirena IUD was placed on 05/04/2014.  Her most recent pap smear was obtained 09/22/2018 and was NIL  Hx of Pap with  positive HRHPV ( no 16, 18, 45).in 2016  Her most recent mammogram obtained 09/30/2019 was normal.  There is a positive history of breast cancer in her paternal aunt. Genetic testing was done on the aunt, but unsure as to results. There is no family history of ovarian cancer.  She has not had a colonoscopy and is eligible. FIT test last year was negative The patient does do monthly self breast exams.  The patient does not smoke.  The patient does drink alcohol occasionally..  The patient does not use illegal drugs.  The patient has not been exercising regularly during Covid. She has just started walking. The patient may not get adequate calcium in her diet.  She has not had a recent cholesterol screen and is  interested in labwork.     Review of Systems: Review of Systems  Constitutional: Negative for chills, fever and weight loss.       Positive for weight gain  HENT: Negative for congestion, sinus pain and sore throat.   Eyes: Negative for blurred vision  and pain.  Respiratory: Negative for hemoptysis, shortness of breath and wheezing.   Cardiovascular: Negative for chest pain, palpitations and leg swelling.  Gastrointestinal: Negative for abdominal pain, blood in stool, diarrhea, heartburn, nausea and vomiting.  Genitourinary: Negative for dysuria, frequency, hematuria and urgency.       Positive for amenorrhea  Musculoskeletal: Negative for back pain, joint pain and myalgias.  Skin: Negative for itching and rash.  Neurological: Negative for dizziness, tingling and headaches.  Endo/Heme/Allergies: Negative for environmental allergies and polydipsia. Does not bruise/bleed easily.       Negative for hirsutism   Psychiatric/Behavioral: Negative for depression. The patient is not nervous/anxious and does not have insomnia.     Past Medical History:  Past Medical History:  Diagnosis Date  . Family history of breast cancer 03/24/2014   gen testing done but not sure of results; 11/19 update testing letter sent    Past Surgical History:  Past Surgical History:  Procedure Laterality Date  . NO PAST SURGERIES      Family History:  Family History  Problem Relation Age of Onset  . Breast cancer Paternal Aunt 83  . Hyperlipidemia Mother   . Hypertension Mother   . Hyperlipidemia Father   . Prostate cancer Father 68  . Hypertension Brother   . Liver disease Brother    OB History  Gravida Para Term Preterm AB Living  2 2 2     2   SAB TAB Ectopic Multiple Live Births          2    # Outcome Date GA Lbr Len/2nd Weight Sex Delivery Anes PTL Lv  2 Term 01/10/09   5 lb 13 oz (2.637 kg) M Vag-Spont   LIV  1 Term 06/15/00   7 lb 5 oz (3.317 kg) M Vag-Spont   LIV   Social History:  Social History   Socioeconomic History  . Marital status: Married    Spouse name: Not on file  . Number of children: 2  . Years of education: 27  . Highest education level: Not on file  Occupational History  . Occupation: HOMEMAKER  Tobacco Use  .  Smoking status: Former Smoker    Quit date: 10/1994    Years since quitting: 25.1  . Smokeless tobacco: Never Used  Substance and Sexual Activity  . Alcohol use: Yes    Comment: occ  . Drug use: Never  . Sexual activity: Yes    Birth control/protection: Other-see comments    Comment: perimenopausal  Other Topics Concern  . Not on file  Social History Narrative  . Not on file   Social Determinants of Health   Financial Resource Strain:   . Difficulty of Paying Living Expenses: Not on file  Food Insecurity:   . Worried About Charity fundraiser in the Last Year: Not on file  . Ran Out of Food in the Last Year: Not on file  Transportation Needs:   . Lack of Transportation (Medical): Not on file  . Lack of Transportation (Non-Medical): Not on file  Physical Activity:   . Days of Exercise per Week: Not on file  . Minutes of Exercise per Session: Not on file  Stress:   . Feeling of Stress : Not on file  Social Connections:   . Frequency of Communication with Friends and Family: Not on file  . Frequency of Social Gatherings with Friends and Family: Not on file  . Attends Religious Services: Not on file  . Active Member of Clubs or Organizations: Not on file  . Attends Archivist Meetings: Not on file  . Marital Status: Not on file  Intimate Partner Violence:   . Fear of Current or Ex-Partner: Not on file  . Emotionally Abused: Not on file  . Physically Abused: Not on file  . Sexually Abused: Not on file    Allergies:  Allergies  Allergen Reactions  . Penicillin G Hives  . Sulfamethoxazole-Trimethoprim Rash    Medications: Vitamin D3    Physical Exam Vitals: BP (!) 158/90   Ht 5\' 5"  (1.651 m)   Wt 201 lb (91.2 kg)   BMI 33.45 kg/m  General: WF in NAD HEENT: normocephalic, anicteric Neck: no thyroid enlargement, no palpable nodules, no cervical lymphadenopathy  Pulmonary: No increased work of breathing, CTAB Cardiovascular: RRR, without murmur   Breast: Breast symmetrical, no tenderness, no palpable nodules or masses, no skin or nipple retraction present, no nipple discharge.  No axillary, infraclavicular or supraclavicular lymphadenopathy. Abdomen: Soft, non-tender, non-distended.  Umbilicus without lesions.  No hepatomegaly or masses palpable. No evidence of hernia. Genitourinary:  External: Normal external female genitalia.  Normal urethral meatus, normal Bartholin's and Skene's glands.    Vagina: Normal vaginal mucosa, no evidence of prolapse.    Cervix: Grossly normal in appearance, no bleeding, non-tender, IUD strings seen and palpated  Uterus: Anteverted, normal size, shape, and  consistency, mobile, and non-tender  Adnexa: No adnexal masses, non-tender  Rectal: no masses. Hemoccult negative.  Lymphatic: no evidence of inguinal lymphadenopathy Extremities: no edema, erythema, or tenderness Neurologic: Grossly intact Psychiatric: mood appropriate, affect full     Assessment: 54 y.o. [id number] annual gyn exam Amenorrhea on IUD/ Perimenopausal IUD intact Elevated blood pressure reading  Plan:   1) Breast cancer screening - recommend monthly self breast exam and annual mammogram. Mammogram is UTD.   2) Colon cancer screening discussed: options regarding colonoscopy, Cologuard, hemoccult testing discussed.  Will do annual hemoccult testing. Hemoccult today was negative.  3) Cervical cancer screening - Pap was done. ASCCP guidelines and rational discussed.  Patient opts for yearly screening interval  4) Contraception - Plan on leaving Mirena in place.  FSH and LH today. If elevated levels, recommend removing next year.  5) Routine healthcare maintenance including cholesterol and diabetes screening offered: desires  Recommend weight loss to help lower blood pressure. Encouraged to make an appointment with PCP to treat/ monitor blood pressures. Discussed calcium and vitamin D3 requirements to help prevent osteoporosis in  addition to exercising.  6) RTO 1 year and prn  Dalia Heading, CNM

## 2019-11-17 ENCOUNTER — Ambulatory Visit (INDEPENDENT_AMBULATORY_CARE_PROVIDER_SITE_OTHER): Payer: Medicaid Other | Admitting: Certified Nurse Midwife

## 2019-11-17 ENCOUNTER — Other Ambulatory Visit (HOSPITAL_COMMUNITY)
Admission: RE | Admit: 2019-11-17 | Discharge: 2019-11-17 | Disposition: A | Discharge status: Home / Self Care | Payer: Medicaid Other | Source: Ambulatory Visit | Attending: Certified Nurse Midwife | Admitting: Certified Nurse Midwife

## 2019-11-17 ENCOUNTER — Other Ambulatory Visit: Payer: Self-pay

## 2019-11-17 VITALS — BP 158/90 | Ht 65.0 in | Wt 201.0 lb

## 2019-11-17 DIAGNOSIS — Z0000 Encounter for general adult medical examination without abnormal findings: Secondary | ICD-10-CM | POA: Diagnosis not present

## 2019-11-17 DIAGNOSIS — N912 Amenorrhea, unspecified: Secondary | ICD-10-CM

## 2019-11-17 DIAGNOSIS — Z1211 Encounter for screening for malignant neoplasm of colon: Secondary | ICD-10-CM

## 2019-11-17 DIAGNOSIS — Z124 Encounter for screening for malignant neoplasm of cervix: Secondary | ICD-10-CM | POA: Diagnosis present

## 2019-11-17 DIAGNOSIS — Z131 Encounter for screening for diabetes mellitus: Secondary | ICD-10-CM

## 2019-11-17 DIAGNOSIS — R030 Elevated blood-pressure reading, without diagnosis of hypertension: Secondary | ICD-10-CM

## 2019-11-17 DIAGNOSIS — Z13220 Encounter for screening for lipoid disorders: Secondary | ICD-10-CM

## 2019-11-18 LAB — LIPID PANEL WITH LDL/HDL RATIO
Cholesterol, Total: 214 mg/dL — ABNORMAL HIGH (ref 100–199)
HDL: 66 mg/dL (ref >39)
LDL Chol Calc (NIH): 133 mg/dL — ABNORMAL HIGH (ref 0–99)
LDL/HDL Ratio: 2 ratio (ref 0.0–3.2)
Triglycerides: 87 mg/dL (ref 0–149)
VLDL Cholesterol Cal: 15 mg/dL (ref 5–40)

## 2019-11-18 LAB — CMP14+EGFR
ALT: 13 IU/L (ref 0–32)
AST: 30 IU/L (ref 0–40)
Albumin/Globulin Ratio: 1.4 (ref 1.2–2.2)
Albumin: 4.3 g/dL (ref 3.8–4.9)
Alkaline Phosphatase: 126 IU/L — ABNORMAL HIGH (ref 39–117)
BUN/Creatinine Ratio: 13 (ref 9–23)
BUN: 11 mg/dL (ref 6–24)
Bilirubin Total: 0.3 mg/dL (ref 0.0–1.2)
CO2: 23 mmol/L (ref 20–29)
Calcium: 9.7 mg/dL (ref 8.7–10.2)
Chloride: 104 mmol/L (ref 96–106)
Creatinine, Ser: 0.85 mg/dL (ref 0.57–1.00)
GFR calc Af Amer: 91 mL/min/{1.73_m2} (ref >59)
GFR calc non Af Amer: 79 mL/min/{1.73_m2} (ref >59)
Globulin, Total: 3 g/dL (ref 1.5–4.5)
Glucose: 103 mg/dL — ABNORMAL HIGH (ref 65–99)
Potassium: 4.3 mmol/L (ref 3.5–5.2)
Sodium: 140 mmol/L (ref 134–144)
Total Protein: 7.3 g/dL (ref 6.0–8.5)

## 2019-11-18 LAB — FSH/LH
FSH: 70 m[IU]/mL
LH: 28.9 m[IU]/mL

## 2019-11-18 LAB — HEMOGLOBIN A1C
Est. average glucose Bld gHb Est-mCnc: 120 mg/dL
Hgb A1c MFr Bld: 5.8 % — ABNORMAL HIGH (ref 4.8–5.6)

## 2019-11-19 LAB — CYTOLOGY - PAP: Diagnosis: NEGATIVE

## 2019-11-20 ENCOUNTER — Encounter: Payer: Self-pay | Admitting: Certified Nurse Midwife

## 2019-11-20 DIAGNOSIS — R030 Elevated blood-pressure reading, without diagnosis of hypertension: Secondary | ICD-10-CM | POA: Insufficient documentation

## 2019-11-20 DIAGNOSIS — Z0000 Encounter for general adult medical examination without abnormal findings: Secondary | ICD-10-CM | POA: Insufficient documentation

## 2019-11-20 LAB — HEMOCCULT GUIAC POC 1CARD (OFFICE): Fecal Occult Blood, POC: NEGATIVE

## 2019-12-31 ENCOUNTER — Encounter: Payer: Self-pay | Admitting: Certified Nurse Midwife

## 2020-02-26 ENCOUNTER — Ambulatory Visit: Payer: Medicaid Other

## 2020-02-26 ENCOUNTER — Ambulatory Visit: Payer: Medicaid Other | Attending: Internal Medicine

## 2020-02-26 DIAGNOSIS — Z23 Encounter for immunization: Secondary | ICD-10-CM

## 2020-02-26 NOTE — Progress Notes (Signed)
   Covid-19 Vaccination Clinic  Name:  [name]    MRN: [id number] DOB: 07-14-1966  02/26/2020  Ms. Ismaili was observed post Covid-19 immunization for 30 minutes based on pre-vaccination screening without incident. She was provided with Vaccine Information Sheet and instruction to access the V-Safe system.   Ms. Stramel was instructed to call 911 with any severe reactions post vaccine: Marland Kitchen Difficulty breathing  . Swelling of face and throat  . A fast heartbeat  . A bad rash all over body  . Dizziness and weakness   Immunizations Administered    Name Date Dose VIS Date Route   Pfizer COVID-19 Vaccine 02/26/2020 10:11 AM 0.3 mL 10/22/2019 Intramuscular   Manufacturer: [place]   Lot: [id number]   [place]: [id number]

## 2020-03-21 ENCOUNTER — Ambulatory Visit: Payer: Medicaid Other | Attending: Internal Medicine

## 2020-03-21 DIAGNOSIS — Z23 Encounter for immunization: Secondary | ICD-10-CM

## 2020-03-21 NOTE — Progress Notes (Signed)
   Covid-19 Vaccination Clinic  Name:  [name]    MRN: [id number] DOB: [birth year]  03/21/2020  [name] was observed post Covid-19 immunization for 30 minutes based on pre-vaccination screening without incident. She was provided with Vaccine Information Sheet and instruction to access the V-Safe system.   [name] was instructed to call 911 with any severe reactions post vaccine: Marland Kitchen Difficulty breathing  . Swelling of face and throat  . A fast heartbeat  . A bad rash all over body  . Dizziness and weakness   Immunizations Administered    Name Date Dose VIS Date Route   Pfizer COVID-19 Vaccine 03/21/2020  3:18 PM 0.3 mL 01/05/2019 Intramuscular   Manufacturer: [place]   Lot: [id number]   [address]: [id number]

## 2020-09-26 ENCOUNTER — Ambulatory Visit
Admission: RE | Admit: 2020-09-26 | Discharge: 2020-09-26 | Disposition: A | Discharge status: Home / Self Care | Payer: Medicaid Other | Source: Ambulatory Visit | Attending: Obstetrics and Gynecology | Admitting: Obstetrics and Gynecology

## 2020-09-26 ENCOUNTER — Other Ambulatory Visit: Payer: Self-pay

## 2020-09-26 ENCOUNTER — Ambulatory Visit: Payer: Medicaid Other | Attending: Oncology

## 2020-09-26 DIAGNOSIS — Z0000 Encounter for general adult medical examination without abnormal findings: Secondary | ICD-10-CM

## 2020-09-26 DIAGNOSIS — N6489 Other specified disorders of breast: Secondary | ICD-10-CM | POA: Insufficient documentation

## 2020-09-26 DIAGNOSIS — Z1231 Encounter for screening mammogram for malignant neoplasm of breast: Secondary | ICD-10-CM | POA: Insufficient documentation

## 2020-10-02 ENCOUNTER — Other Ambulatory Visit: Payer: Self-pay | Admitting: Obstetrics and Gynecology

## 2020-10-02 DIAGNOSIS — R928 Other abnormal and inconclusive findings on diagnostic imaging of breast: Secondary | ICD-10-CM

## 2020-10-02 DIAGNOSIS — N6489 Other specified disorders of breast: Secondary | ICD-10-CM

## 2020-11-06 ENCOUNTER — Ambulatory Visit
Admission: RE | Admit: 2020-11-06 | Discharge: 2020-11-06 | Disposition: A | Discharge status: Home / Self Care | Payer: Medicaid Other | Source: Ambulatory Visit | Attending: Obstetrics and Gynecology | Admitting: Obstetrics and Gynecology

## 2020-11-06 ENCOUNTER — Encounter: Payer: Self-pay | Admitting: Obstetrics and Gynecology

## 2020-11-06 ENCOUNTER — Other Ambulatory Visit: Payer: Self-pay

## 2020-11-06 DIAGNOSIS — R928 Other abnormal and inconclusive findings on diagnostic imaging of breast: Secondary | ICD-10-CM | POA: Insufficient documentation

## 2020-11-06 DIAGNOSIS — N6489 Other specified disorders of breast: Secondary | ICD-10-CM | POA: Insufficient documentation

## 2020-11-22 ENCOUNTER — Ambulatory Visit: Payer: Medicaid Other | Admitting: Obstetrics and Gynecology

## 2020-11-24 ENCOUNTER — Other Ambulatory Visit: Payer: Self-pay

## 2020-11-24 ENCOUNTER — Ambulatory Visit: Payer: Medicaid Other | Attending: Internal Medicine

## 2020-11-24 ENCOUNTER — Other Ambulatory Visit: Payer: Self-pay | Admitting: Internal Medicine

## 2020-11-24 DIAGNOSIS — Z23 Encounter for immunization: Secondary | ICD-10-CM

## 2020-11-24 NOTE — Progress Notes (Signed)
   Covid-19 Vaccination Clinic  Name:  [name]    MRN: [id number] DOB: March 07, 1966  11/24/2020  [name] was observed post Covid-19 immunization for 15 minutes without incident. She was provided with Vaccine Information Sheet and instruction to access the V-Safe system.   [name] was instructed to call 911 with any severe reactions post vaccine: Marland Kitchen Difficulty breathing  . Swelling of face and throat  . A fast heartbeat  . A bad rash all over body  . Dizziness and weakness   Immunizations Administered    Name Date Dose VIS Date Route   Pfizer COVID-19 Vaccine 11/24/2020 10:37 AM 0.3 mL 08/30/2020 Intramuscular   Manufacturer: [place]   Lot: [id number]   [place]: [id number]

## 2020-12-20 ENCOUNTER — Other Ambulatory Visit: Payer: Self-pay

## 2020-12-20 ENCOUNTER — Other Ambulatory Visit (HOSPITAL_COMMUNITY)
Admission: RE | Admit: 2020-12-20 | Discharge: 2020-12-20 | Disposition: A | Discharge status: Home / Self Care | Payer: Medicaid Other | Source: Ambulatory Visit | Attending: Obstetrics and Gynecology | Admitting: Obstetrics and Gynecology

## 2020-12-20 ENCOUNTER — Ambulatory Visit (INDEPENDENT_AMBULATORY_CARE_PROVIDER_SITE_OTHER): Payer: Medicaid Other | Admitting: Obstetrics and Gynecology

## 2020-12-20 ENCOUNTER — Encounter: Payer: Self-pay | Admitting: Obstetrics and Gynecology

## 2020-12-20 VITALS — BP 120/70 | Ht 65.0 in | Wt 191.0 lb

## 2020-12-20 DIAGNOSIS — Z1151 Encounter for screening for human papillomavirus (HPV): Secondary | ICD-10-CM

## 2020-12-20 DIAGNOSIS — R87810 Cervical high risk human papillomavirus (HPV) DNA test positive: Secondary | ICD-10-CM | POA: Diagnosis present

## 2020-12-20 DIAGNOSIS — Z124 Encounter for screening for malignant neoplasm of cervix: Secondary | ICD-10-CM | POA: Diagnosis not present

## 2020-12-20 DIAGNOSIS — Z1211 Encounter for screening for malignant neoplasm of colon: Secondary | ICD-10-CM

## 2020-12-20 DIAGNOSIS — Z30431 Encounter for routine checking of intrauterine contraceptive device: Secondary | ICD-10-CM

## 2020-12-20 DIAGNOSIS — Z803 Family history of malignant neoplasm of breast: Secondary | ICD-10-CM

## 2020-12-20 DIAGNOSIS — Z01419 Encounter for gynecological examination (general) (routine) without abnormal findings: Secondary | ICD-10-CM | POA: Diagnosis not present

## 2020-12-20 DIAGNOSIS — Z1231 Encounter for screening mammogram for malignant neoplasm of breast: Secondary | ICD-10-CM

## 2020-12-20 NOTE — Patient Instructions (Signed)
I value your feedback and you entrusting us with your care. If you get a [facility] patient survey, I would appreciate you taking the time to let us know about your experience today. Thank you! ? ? ?

## 2020-12-20 NOTE — Progress Notes (Signed)
PCP: Dalia Heading, CNM (Inactive)   Chief Complaint  Patient presents with  . Gynecologic Exam    No concerns    HPI:      Ms. [name] is a 55 y.o. [phone] whose LMP was No LMP recorded. (Menstrual status: IUD)., presents today for her annual examination. Was [facility] pt. Her menses are absent with IUD. No BTB, dysmen.  Mirena placed 05/04/14. Discussed removal vs waiting till next yr since has 7 yr indication. Pt elects to keep it in 1 more yr.   Sex activity: single partner, contraception - IUD. She does not have vaginal dryness.  Last Pap: 11/27/19  Results were: no abnormalities ; hx of pos HPV DNA in 2016.  Hx of STDs: HPV on pap  Last mammogram: 11/06/20  Results were: normal--routine follow-up in 12 months after addl views There is a FH of breast cancer in her pat aunt and pancreatic cancer in her dad. Her aunt had neg gene testing yrs ago and her dad had some type of testing. Pt has letter at home with info. There is no FH of ovarian cancer. The patient does do self-breast exams.  Colonoscopy: never  Tobacco use: The patient denies current or previous tobacco use. Alcohol use: social drinker  No drug use Exercise: moderately active  She does get adequate calcium but not Vitamin D in her diet.  Labs with [facility] last yr. Had pre-DM and borderline lipids. Was supposed to get PCP but hasn't yet.    Past Medical History:  Diagnosis Date  . Family history of breast cancer 03/24/2014   gen testing done but not sure of results; 11/19 update testing letter sent    Past Surgical History:  Procedure Laterality Date  . NO PAST SURGERIES      Family History  Problem Relation Age of Onset  . Breast cancer Paternal Aunt 57  . Hyperlipidemia Mother   . Hypertension Mother   . Hyperlipidemia Father   . Pancreatic cancer Father 70  . Hypertension Brother   . Liver disease Brother     Social History   Socioeconomic History  . Marital status: Married     Spouse name: Not on file  . Number of children: 2  . Years of education: 22  . Highest education level: Not on file  Occupational History  . Occupation: HOMEMAKER  Tobacco Use  . Smoking status: Former Smoker    Quit date: 10/1994    Years since quitting: 26.2  . Smokeless tobacco: Never Used  Vaping Use  . Vaping Use: Never used  Substance and Sexual Activity  . Alcohol use: Yes    Comment: occ  . Drug use: Never  . Sexual activity: Yes    Birth control/protection: Other-see comments, I.U.D.    Comment: perimenopausal, Mirena  Other Topics Concern  . Not on file  Social History Narrative  . Not on file   Social Determinants of Health   Financial Resource Strain: Not on file  Food Insecurity: Not on file  Transportation Needs: Not on file  Physical Activity: Not on file  Stress: Not on file  Social Connections: Not on file  Intimate Partner Violence: Not on file     Current Outpatient Medications:  .  ibuprofen (ADVIL,MOTRIN) 200 MG tablet, Take 2 tablets by mouth as needed., Disp: , Rfl:  .  levonorgestrel (MIRENA) 20 MCG/24HR IUD, 1 each by Intrauterine route once., Disp: , Rfl:      ROS:  Review  of Systems  Constitutional: Negative for fatigue, fever and unexpected weight change.  Respiratory: Negative for cough, shortness of breath and wheezing.   Cardiovascular: Negative for chest pain, palpitations and leg swelling.  Gastrointestinal: Negative for blood in stool, constipation, diarrhea, nausea and vomiting.  Endocrine: Negative for cold intolerance, heat intolerance and polyuria.  Genitourinary: Negative for dyspareunia, dysuria, flank pain, frequency, genital sores, hematuria, menstrual problem, pelvic pain, urgency, vaginal bleeding, vaginal discharge and vaginal pain.  Musculoskeletal: Negative for back pain, joint swelling and myalgias.  Skin: Negative for rash.  Neurological: Negative for dizziness, syncope, light-headedness, numbness and headaches.   Hematological: Negative for adenopathy.  Psychiatric/Behavioral: Negative for agitation, confusion, sleep disturbance and suicidal ideas. The patient is not nervous/anxious.   BREAST: No symptoms   Objective: BP 120/70   Ht 5\' 5"  (1.651 m)   Wt 191 lb (86.6 kg)   BMI 31.78 kg/m    Physical Exam Constitutional:      Appearance: She is well-developed.  Genitourinary:     Vulva normal.     Right Labia: No rash, tenderness or lesions.    Left Labia: No tenderness, lesions or rash.    No vaginal discharge, erythema or tenderness.      Right Adnexa: not tender and no mass present.    Left Adnexa: not tender and no mass present.    No cervical friability or polyp.     Uterus is not enlarged or tender.  Breasts:     Right: No mass, nipple discharge, skin change or tenderness.     Left: No mass, nipple discharge, skin change or tenderness.    Neck:     Thyroid: No thyromegaly.  Cardiovascular:     Rate and Rhythm: Normal rate and regular rhythm.     Heart sounds: Normal heart sounds. No murmur heard.   Pulmonary:     Effort: Pulmonary effort is normal.     Breath sounds: Normal breath sounds.  Abdominal:     Palpations: Abdomen is soft.     Tenderness: There is no abdominal tenderness. There is no guarding or rebound.  Musculoskeletal:        General: Normal range of motion.     Cervical back: Normal range of motion.  Lymphadenopathy:     Cervical: No cervical adenopathy.  Neurological:     General: No focal deficit present.     Mental Status: She is alert and oriented to person, place, and time.     Cranial Nerves: No cranial nerve deficit.  Skin:    General: Skin is warm and dry.  Psychiatric:        Mood and Affect: Mood normal.        Behavior: Behavior normal.        Thought Content: Thought content normal.        Judgment: Judgment normal.  Vitals reviewed.     Assessment/Plan:  Encounter for annual routine gynecological examination  Cervical cancer  screening - Plan: Cytology - PAP  Screening for HPV (human papillomavirus) - Plan: Cytology - PAP  Cervical high risk HPV (human papillomavirus) test positive - Plan: Cytology - PAP; repeat pap today  Encounter for routine checking of intrauterine contraceptive device (IUD)--IUD strings in cx os; due for removal 6/22. Discussed removal this yr vs next. Pt prefers to wait till next yr.   Encounter for screening mammogram for malignant neoplasm of breast - Plan: MM 3D SCREEN BREAST BILATERAL; pt to sched mammo 11/22  Family history  of breast cancer--MyRisk testing discussed and declined today. Pt wants to find info at home from her dad and aunt. Will f/u if desires. Handout given.   Screening for colon cancer--Plan: Cologuard; colonoscopy/cologuard discussed. Pt elects cologuard. Ref sent. Will f/u with results.          GYN counsel breast self exam, mammography screening, menopause, adequate intake of calcium and vitamin D, diet and exercise    F/U  Return in about 1 year (around 12/20/2021).  Jaquanna Ballentine B. Quita Mcgrory, PA-C 12/20/2020 11:44 AM

## 2020-12-21 LAB — CYTOLOGY - PAP
Comment: NEGATIVE
Diagnosis: NEGATIVE
High risk HPV: NEGATIVE

## 2021-01-07 IMAGING — MG MM DIGITAL DIAGNOSTIC UNILAT*R* W/ TOMO W/ CAD
4 series · 4 of 12 positions shown · non-contrast
Comparison: Previous exam(s).

CLINICAL DATA: 54-year-old female for further evaluation of
possible RIGHT breast asymmetry on screening mammogram.

EXAM:
DIGITAL DIAGNOSTIC UNILATERAL RIGHT MAMMOGRAM WITH TOMO AND CAD

[R CC synth-2D]
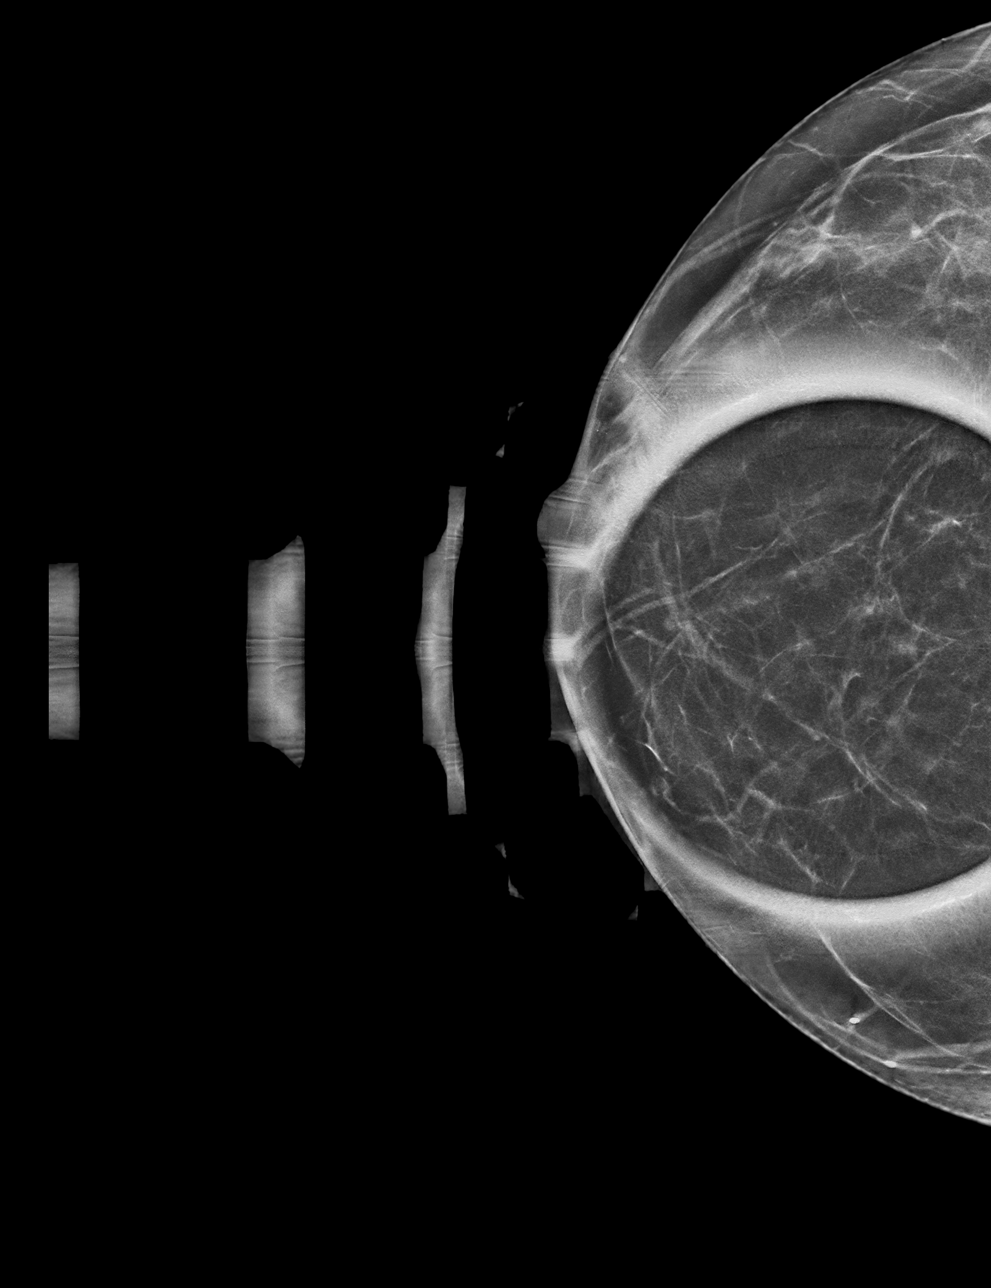

[R ML synth-2D]
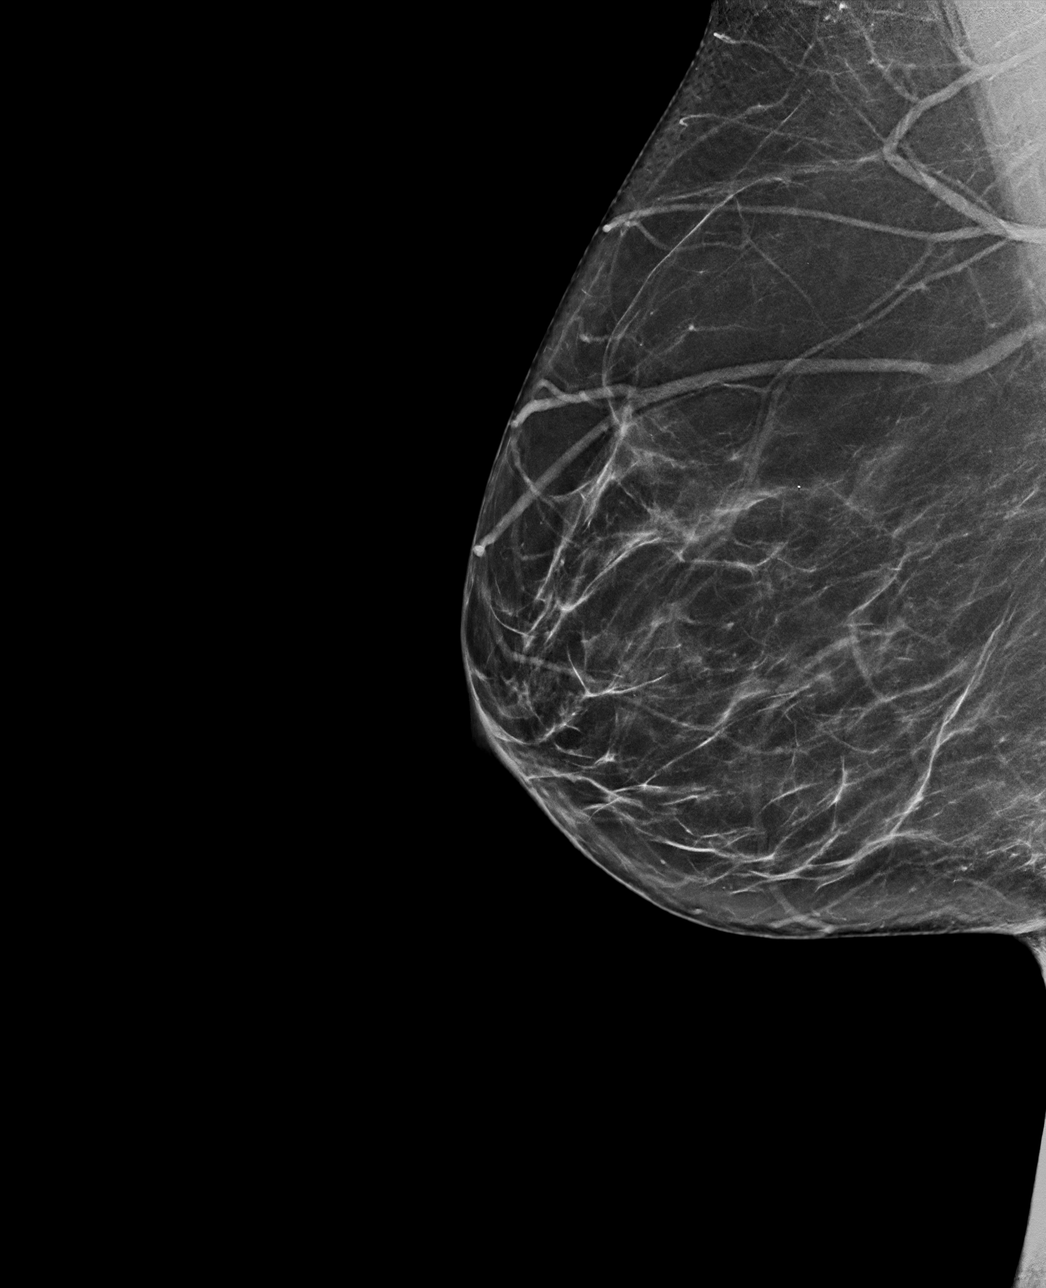

[R ML tomo · tomo slice 39/76.0]
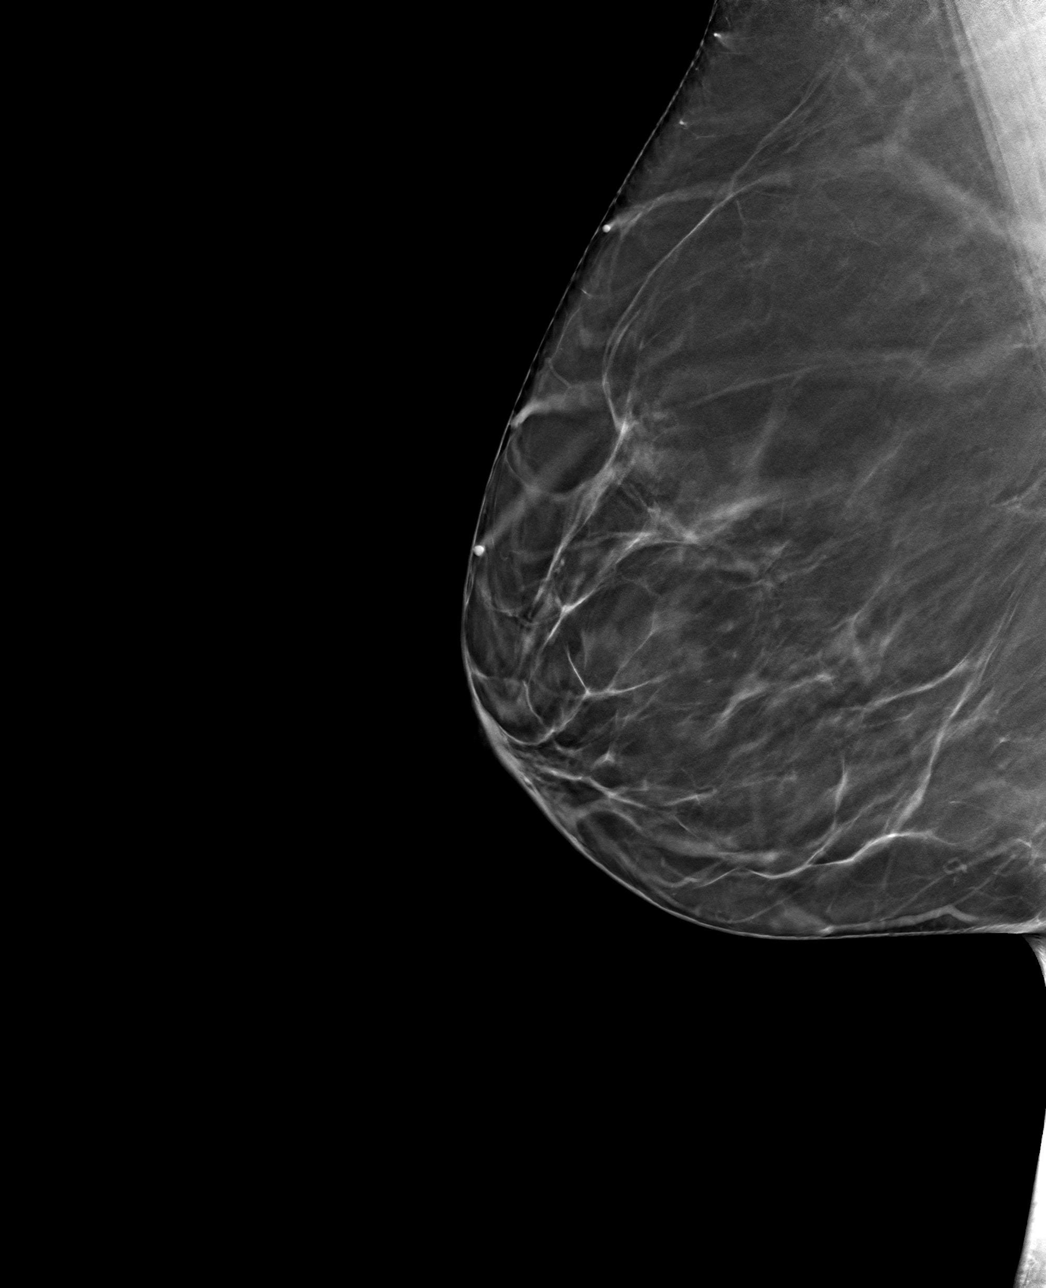

[R CC tomo · tomo slice 27/53.0]
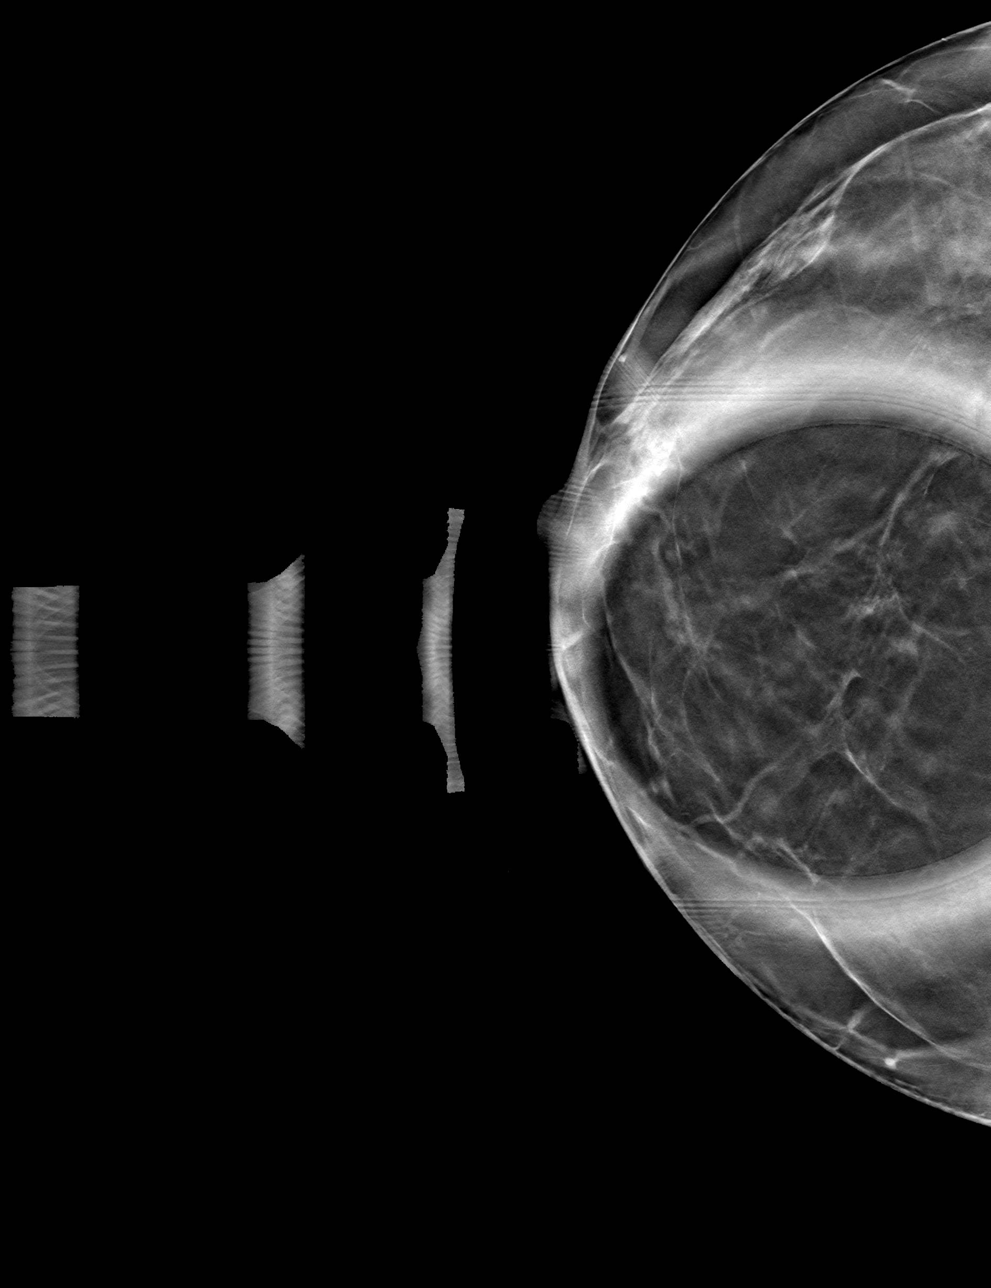

[4 of 12 positions shown; findings below may reference images not displayed]

ACR Breast Density Category b: There are scattered areas of
fibroglandular density.
FINDINGS: 2D/3D full field and spot compression views of the RIGHT breast
demonstrate no persistent abnormality at the site of the screening
study finding.

Mammographic images were processed with CAD.
IMPRESSION: No persistent abnormality at the site of the screening study
finding.

RECOMMENDATION:
Bilateral screening mammogram in 1 year.

I have discussed the findings and recommendations with the patient.
If applicable, a reminder letter will be sent to the patient
regarding the next appointment.

BI-RADS CATEGORY  1: Negative.

## 2021-02-21 ENCOUNTER — Telehealth: Payer: Self-pay

## 2021-02-21 NOTE — Telephone Encounter (Signed)
Called pt to f/u on Cologuard test, is she still planning on completing it? No answer, LVMTRC.

## 2022-01-03 ENCOUNTER — Ambulatory Visit: Payer: Medicaid Other | Admitting: Obstetrics and Gynecology

## 2022-01-07 NOTE — Progress Notes (Signed)
PCP: Dalia Heading, CNM (Inactive)   Chief Complaint  Patient presents with   Gynecologic Exam    No concerns    HPI:      Ms. [name] is a 56 y.o. [phone] whose LMP was No LMP recorded. (Menstrual status: IUD)., presents today for her annual examination. Her menses are absent with IUD. No BTB, dysmen.  Mirena placed 05/04/14. Discussed removal last yr vs waiting till this yr since had 8 yr indication. Pt elected to keep it in 1 more yr. Due for removal this yr.  Sex activity: single partner, occas sex active; contraception - IUD. She does not have vaginal dryness.  Last Pap: 12/20/20 Results were: no abnormalities /neg HPV DNA  Hx of STDs: HPV on pap  Last mammogram: 11/06/20  Results were: normal--routine follow-up in 12 months after addl views There is a FH of breast cancer in her pat aunt and pancreatic cancer in her dad. Her aunt had neg gene testing yrs ago and her dad had some type of testing. Pt has letter at home with info; pt declined genetic testing last yr. There is no FH of ovarian cancer. The patient does do self-breast exams.   Colonoscopy: never; never did Cologuard last yr. No rectal bleeding.   Tobacco use: The patient denies current or previous tobacco use. Alcohol use: social drinker  No drug use Exercise: moderately active  She does get adequate calcium and Vitamin D in her diet.  Hx of pre-DM and borderline lipids. Never got PCP. Due for repeat labs.    Past Medical History:  Diagnosis Date   Family history of breast cancer 03/24/2014   gen testing done but not sure of results; 11/19 update testing letter sent    Past Surgical History:  Procedure Laterality Date   NO PAST SURGERIES      Family History  Problem Relation Age of Onset   Breast cancer Paternal Aunt 1   Hyperlipidemia Mother    Hypertension Mother    Hyperlipidemia Father    Pancreatic cancer Father 61   Hypertension Brother    Liver disease Brother      Social History   Socioeconomic History   Marital status: Married    Spouse name: Not on file   Number of children: 2   Years of education: 16   Highest education level: Not on file  Occupational History   Occupation: HOMEMAKER  Tobacco Use   Smoking status: Former    Types: Cigarettes    Quit date: 10/1994    Years since quitting: 27.2   Smokeless tobacco: Never  Vaping Use   Vaping Use: Never used  Substance and Sexual Activity   Alcohol use: Yes    Comment: occ   Drug use: Never   Sexual activity: Yes    Birth control/protection: Other-see comments, I.U.D.    Comment: perimenopausal, Mirena  Other Topics Concern   Not on file  Social History Narrative   Not on file   Social Determinants of Health   Financial Resource Strain: Not on file  Food Insecurity: Not on file  Transportation Needs: Not on file  Physical Activity: Not on file  Stress: Not on file  Social Connections: Not on file  Intimate Partner Violence: Not on file     Current Outpatient Medications:    ibuprofen (ADVIL,MOTRIN) 200 MG tablet, Take 2 tablets by mouth as needed., Disp: , Rfl:    levonorgestrel (MIRENA) 20 MCG/24HR IUD, 1 each by Intrauterine  route once., Disp: , Rfl:      ROS:  Review of Systems  Constitutional:  Negative for fatigue, fever and unexpected weight change.  Respiratory:  Negative for cough, shortness of breath and wheezing.   Cardiovascular:  Negative for chest pain, palpitations and leg swelling.  Gastrointestinal:  Negative for blood in stool, constipation, diarrhea, nausea and vomiting.  Endocrine: Negative for cold intolerance, heat intolerance and polyuria.  Genitourinary:  Negative for dyspareunia, dysuria, flank pain, frequency, genital sores, hematuria, menstrual problem, pelvic pain, urgency, vaginal bleeding, vaginal discharge and vaginal pain.  Musculoskeletal:  Negative for back pain, joint swelling and myalgias.  Skin:  Negative for rash.   Neurological:  Negative for dizziness, syncope, light-headedness, numbness and headaches.  Hematological:  Negative for adenopathy.  Psychiatric/Behavioral:  Negative for agitation, confusion, sleep disturbance and suicidal ideas. The patient is not nervous/anxious.  BREAST: No symptoms   Objective: BP 102/80    Ht 5\' 5"  (1.651 m)    Wt 181 lb (82.1 kg)    BMI 30.12 kg/m    Physical Exam Constitutional:      Appearance: She is well-developed.  Genitourinary:     Vulva normal.     Right Labia: No rash, tenderness or lesions.    Left Labia: No tenderness, lesions or rash.    No vaginal discharge, erythema or tenderness.      Right Adnexa: not tender and no mass present.    Left Adnexa: not tender and no mass present.    No cervical friability or polyp.     IUD strings visualized.     Uterus is not enlarged or tender.  Breasts:    Right: No mass, nipple discharge, skin change or tenderness.     Left: No mass, nipple discharge, skin change or tenderness.  Neck:     Thyroid: No thyromegaly.  Cardiovascular:     Rate and Rhythm: Normal rate and regular rhythm.     Heart sounds: Normal heart sounds. No murmur heard. Pulmonary:     Effort: Pulmonary effort is normal.     Breath sounds: Normal breath sounds.  Abdominal:     Palpations: Abdomen is soft.     Tenderness: There is no abdominal tenderness. There is no guarding or rebound.  Musculoskeletal:        General: Normal range of motion.     Cervical back: Normal range of motion.  Lymphadenopathy:     Cervical: No cervical adenopathy.  Neurological:     General: No focal deficit present.     Mental Status: She is alert and oriented to person, place, and time.     Cranial Nerves: No cranial nerve deficit.  Skin:    General: Skin is warm and dry.  Psychiatric:        Mood and Affect: Mood normal.        Behavior: Behavior normal.        Thought Content: Thought content normal.        Judgment: Judgment normal.  Vitals  reviewed.   IUD Removal Strings of IUD identified and grasped.  IUD removed without problem with ring forceps.  Pt tolerated this well.  IUD noted to be intact. She was amenable to this plan.   Assessment/Plan:  Encounter for annual routine gynecological examination  Encounter for screening mammogram for malignant neoplasm of breast - Plan: MM 3D SCREEN BREAST BILATERAL; pt to schedule mammo  Family history of breast cancer - Plan: MM 3D SCREEN BREAST BILATERAL;  MyRisk testing discussed, pt to f/u if desires.   Screening for colon cancer - Plan: Cologuard; colonoscopy/cologuard discussed. Pt elects cologuard. Ref sent. Will f/u with results.  Blood tests for routine general physical examination - Plan: Comprehensive metabolic panel, Lipid panel  Screening cholesterol level - Plan: Lipid panel  Encounter for IUD removal--tolerated well. F/u prn bleeding          GYN counsel breast self exam, mammography screening, menopause, adequate intake of calcium and vitamin D, diet and exercise    F/U  Return in about 1 year (around 01/08/2023).  Winfred Iiams B. Emme Rosenau, PA-C 01/08/2022 11:08 AM

## 2022-01-08 ENCOUNTER — Ambulatory Visit (INDEPENDENT_AMBULATORY_CARE_PROVIDER_SITE_OTHER): Payer: Medicaid Other | Admitting: Obstetrics and Gynecology

## 2022-01-08 ENCOUNTER — Encounter: Payer: Self-pay | Admitting: Obstetrics and Gynecology

## 2022-01-08 ENCOUNTER — Other Ambulatory Visit: Payer: Self-pay

## 2022-01-08 VITALS — BP 102/80 | Ht 65.0 in | Wt 181.0 lb

## 2022-01-08 DIAGNOSIS — Z803 Family history of malignant neoplasm of breast: Secondary | ICD-10-CM | POA: Diagnosis not present

## 2022-01-08 DIAGNOSIS — Z0000 Encounter for general adult medical examination without abnormal findings: Secondary | ICD-10-CM

## 2022-01-08 DIAGNOSIS — Z01419 Encounter for gynecological examination (general) (routine) without abnormal findings: Secondary | ICD-10-CM | POA: Diagnosis not present

## 2022-01-08 DIAGNOSIS — Z30432 Encounter for removal of intrauterine contraceptive device: Secondary | ICD-10-CM

## 2022-01-08 DIAGNOSIS — Z13220 Encounter for screening for lipoid disorders: Secondary | ICD-10-CM

## 2022-01-08 DIAGNOSIS — Z1211 Encounter for screening for malignant neoplasm of colon: Secondary | ICD-10-CM | POA: Diagnosis not present

## 2022-01-08 DIAGNOSIS — Z1231 Encounter for screening mammogram for malignant neoplasm of breast: Secondary | ICD-10-CM | POA: Diagnosis not present

## 2022-01-08 NOTE — Patient Instructions (Signed)
I value your feedback and you entrusting us with your care. If you get a [facility] patient survey, I would appreciate you taking the time to let us know about your experience today. Thank you!  [facility]: [phone]      

## 2022-01-09 LAB — COMPREHENSIVE METABOLIC PANEL
ALT: 7 IU/L (ref 0–32)
AST: 20 IU/L (ref 0–40)
Albumin/Globulin Ratio: 1.9 (ref 1.2–2.2)
Albumin: 4.6 g/dL (ref 3.8–4.9)
Alkaline Phosphatase: 101 IU/L (ref 44–121)
BUN/Creatinine Ratio: 15 (ref 9–23)
BUN: 13 mg/dL (ref 6–24)
Bilirubin Total: 0.7 mg/dL (ref 0.0–1.2)
CO2: 24 mmol/L (ref 20–29)
Calcium: 9.6 mg/dL (ref 8.7–10.2)
Chloride: 102 mmol/L (ref 96–106)
Creatinine, Ser: 0.86 mg/dL (ref 0.57–1.00)
Globulin, Total: 2.4 g/dL (ref 1.5–4.5)
Glucose: 95 mg/dL (ref 70–99)
Potassium: 4.4 mmol/L (ref 3.5–5.2)
Sodium: 140 mmol/L (ref 134–144)
Total Protein: 7 g/dL (ref 6.0–8.5)
eGFR: 80 mL/min/{1.73_m2} (ref >59)

## 2022-01-09 LAB — LIPID PANEL
Chol/HDL Ratio: 3.7 ratio (ref 0.0–4.4)
Cholesterol, Total: 232 mg/dL — ABNORMAL HIGH (ref 100–199)
HDL: 63 mg/dL (ref >39)
LDL Chol Calc (NIH): 156 mg/dL — ABNORMAL HIGH (ref 0–99)
Triglycerides: 76 mg/dL (ref 0–149)
VLDL Cholesterol Cal: 13 mg/dL (ref 5–40)

## 2022-02-15 ENCOUNTER — Ambulatory Visit
Admission: RE | Admit: 2022-02-15 | Discharge: 2022-02-15 | Disposition: A | Discharge status: Home / Self Care | Payer: Medicaid Other | Source: Ambulatory Visit | Attending: Obstetrics and Gynecology | Admitting: Obstetrics and Gynecology

## 2022-02-15 DIAGNOSIS — Z1231 Encounter for screening mammogram for malignant neoplasm of breast: Secondary | ICD-10-CM | POA: Insufficient documentation

## 2022-02-15 DIAGNOSIS — Z803 Family history of malignant neoplasm of breast: Secondary | ICD-10-CM | POA: Diagnosis present

## 2022-02-20 LAB — COLOGUARD: COLOGUARD: POSITIVE — AB

## 2022-02-27 ENCOUNTER — Telehealth: Payer: Self-pay

## 2022-02-27 NOTE — Telephone Encounter (Signed)
Called pt to f/u on results, no answer, LVMTRC. ?

## 2022-03-06 ENCOUNTER — Other Ambulatory Visit: Payer: Self-pay | Admitting: Obstetrics and Gynecology

## 2022-03-06 ENCOUNTER — Encounter: Payer: Self-pay | Admitting: Obstetrics and Gynecology

## 2022-03-06 ENCOUNTER — Telehealth: Payer: Self-pay

## 2022-03-06 DIAGNOSIS — R195 Other fecal abnormalities: Secondary | ICD-10-CM

## 2022-03-06 NOTE — Telephone Encounter (Signed)
LVM for pt to return my call.   Thanks,  Danikah, CMA 

## 2022-03-08 ENCOUNTER — Telehealth: Payer: Self-pay

## 2022-03-08 NOTE — Telephone Encounter (Signed)
CALLED PATIENT NO ANSWER LEFT VOICEMAIL FOR A CALL BACK °Letter sent °

## 2022-04-09 ENCOUNTER — Telehealth: Payer: Self-pay

## 2022-04-09 NOTE — Telephone Encounter (Signed)
CALLED PATIENT NO ANSWER LEFT VOICEMAIL FOR A CALL BACK ? ?

## 2022-05-21 ENCOUNTER — Other Ambulatory Visit: Payer: Self-pay

## 2022-05-21 DIAGNOSIS — R195 Other fecal abnormalities: Secondary | ICD-10-CM

## 2022-05-21 MED ORDER — NA SULFATE-K SULFATE-MG SULF 17.5-3.13-1.6 GM/177ML PO SOLN: 1.0000 | Freq: Once | ORAL | 0 refills | Status: AC

## 2022-05-21 NOTE — Progress Notes (Signed)
Gastroenterology Pre-Procedure Review  Request Date: 06/05/2022 Requesting Physician: Dr. Vicente Males  PATIENT REVIEW QUESTIONS: The patient responded to the following health history questions as indicated:    1. Are you having any GI issues? no 2. Do you have a personal history of Polyps? Positve cologuard 3. Do you have a family history of Colon Cancer or Polyps? no 4. Diabetes Mellitus? no 5. Joint replacements in the past 12 months?no 6. Major health problems in the past 3 months?no 7. Any artificial heart valves, MVP, or defibrillator?no    MEDICATIONS & ALLERGIES:    Patient reports the following regarding taking any anticoagulation/antiplatelet therapy:   Plavix, Coumadin, Eliquis, Xarelto, Lovenox, Pradaxa, Brilinta, or Effient? no Aspirin? no  Patient confirms/reports the following medications:  Current Outpatient Medications  Medication Sig Dispense Refill   ibuprofen (ADVIL,MOTRIN) 200 MG tablet Take 2 tablets by mouth as needed.     levonorgestrel (MIRENA) 20 MCG/24HR IUD 1 each by Intrauterine route once.     No current facility-administered medications for this visit.    Patient confirms/reports the following allergies:  Allergies  Allergen Reactions   Penicillin G Hives   Sulfamethoxazole-Trimethoprim Rash    No orders of the defined types were placed in this encounter.   AUTHORIZATION INFORMATION Primary Insurance: 1D#: Group #:  Secondary Insurance: 1D#: Group #:  SCHEDULE INFORMATION: Date: 06/05/2022 Time: Location:[facility]

## 2022-06-03 ENCOUNTER — Telehealth: Payer: Self-pay

## 2022-06-03 NOTE — Telephone Encounter (Signed)
Patients call has been returned.  She has requested her SuPrep bowel prep to be sent to the pharmacy.  It was returned back to the shelf.  Contacted Melanie at [facility] to request them to fill rx for her to pick up today.  Thanks,  [employer], [place]

## 2022-06-04 ENCOUNTER — Encounter: Payer: Self-pay | Admitting: Gastroenterology

## 2022-06-05 ENCOUNTER — Ambulatory Visit: Payer: Medicaid Other | Admitting: Anesthesiology

## 2022-06-05 ENCOUNTER — Ambulatory Visit
Admission: RE | Admit: 2022-06-05 | Discharge: 2022-06-05 | Disposition: A | Discharge status: Home / Self Care | Payer: Medicaid Other | Attending: Gastroenterology | Admitting: Gastroenterology

## 2022-06-05 ENCOUNTER — Encounter
Admission: RE | Disposition: A | Discharge status: Home / Self Care | Payer: Self-pay | Source: Home / Self Care | Attending: Gastroenterology

## 2022-06-05 ENCOUNTER — Encounter: Payer: Self-pay | Admitting: Gastroenterology

## 2022-06-05 DIAGNOSIS — D126 Benign neoplasm of colon, unspecified: Secondary | ICD-10-CM

## 2022-06-05 DIAGNOSIS — R195 Other fecal abnormalities: Secondary | ICD-10-CM | POA: Diagnosis not present

## 2022-06-05 DIAGNOSIS — Z1211 Encounter for screening for malignant neoplasm of colon: Secondary | ICD-10-CM | POA: Insufficient documentation

## 2022-06-05 DIAGNOSIS — K5730 Diverticulosis of large intestine without perforation or abscess without bleeding: Secondary | ICD-10-CM | POA: Diagnosis not present

## 2022-06-05 DIAGNOSIS — D123 Benign neoplasm of transverse colon: Secondary | ICD-10-CM | POA: Diagnosis not present

## 2022-06-05 DIAGNOSIS — Z87891 Personal history of nicotine dependence: Secondary | ICD-10-CM | POA: Insufficient documentation

## 2022-06-05 HISTORY — PX: COLONOSCOPY WITH PROPOFOL: SHX5780

## 2022-06-05 SURGERY — COLONOSCOPY WITH PROPOFOL
Anesthesia: General

## 2022-06-05 MED ORDER — LIDOCAINE HCL (PF) 2 % IJ SOLN
INTRAMUSCULAR | Status: AC
  Filled 2022-06-05: qty 10

## 2022-06-05 MED ORDER — PROPOFOL 10 MG/ML IV BOLUS
INTRAVENOUS | Status: DC | PRN
  Administered 2022-06-05: 50 mg via INTRAVENOUS

## 2022-06-05 MED ORDER — LIDOCAINE HCL (CARDIAC) PF 100 MG/5ML IV SOSY
PREFILLED_SYRINGE | INTRAVENOUS | Status: DC | PRN
  Administered 2022-06-05: 50 mg via INTRAVENOUS

## 2022-06-05 MED ORDER — MIDAZOLAM HCL 2 MG/2ML IJ SOLN
INTRAMUSCULAR | Status: DC | PRN
  Administered 2022-06-05: 2 mg via INTRAVENOUS

## 2022-06-05 MED ORDER — MIDAZOLAM HCL 2 MG/2ML IJ SOLN
INTRAMUSCULAR | Status: AC
  Filled 2022-06-05: qty 2

## 2022-06-05 MED ORDER — PROPOFOL 1000 MG/100ML IV EMUL
INTRAVENOUS | Status: AC
  Filled 2022-06-05: qty 100

## 2022-06-05 MED ORDER — SODIUM CHLORIDE 0.9 % IV SOLN: INTRAVENOUS | Status: DC

## 2022-06-05 MED ORDER — PROPOFOL 500 MG/50ML IV EMUL
INTRAVENOUS | Status: DC | PRN
  Administered 2022-06-05: 75 ug/kg/min via INTRAVENOUS

## 2022-06-05 NOTE — Anesthesia Preprocedure Evaluation (Signed)
Anesthesia Evaluation  Patient identified by MRN, date of birth, ID band Patient awake    Reviewed: Allergy & Precautions, NPO status , Patient's Chart, lab work & pertinent test results  Airway Mallampati: II  TM Distance: >3 FB Neck ROM: full    Dental  (+) Teeth Intact   Pulmonary neg pulmonary ROS, former smoker,    Pulmonary exam normal breath sounds clear to auscultation       Cardiovascular negative cardio ROS Normal cardiovascular exam Rhythm:Regular Rate:Normal     Neuro/Psych negative neurological ROS  negative psych ROS   GI/Hepatic negative GI ROS, Neg liver ROS,   Endo/Other  negative endocrine ROS  Renal/GU negative Renal ROS  negative genitourinary   Musculoskeletal   Abdominal Normal abdominal exam  (+)   Peds negative pediatric ROS (+)  Hematology negative hematology ROS (+)   Anesthesia Other Findings Past Medical History: No date: Elevated lipids 03/24/2014: Family history of breast cancer     Comment:  gen testing done but not sure of results; 11/19 update               testing letter sent  Past Surgical History: No date: NO PAST SURGERIES No date: WISDOM TOOTH EXTRACTION  BMI    Body Mass Index: 29.95 kg/m      Reproductive/Obstetrics negative OB ROS                             Anesthesia Physical Anesthesia Plan  ASA: 2  Anesthesia Plan: General   Post-op Pain Management:    Induction: Intravenous  PONV Risk Score and Plan: Propofol infusion and TIVA  Airway Management Planned: Natural Airway  Additional Equipment:   Intra-op Plan:   Post-operative Plan:   Informed Consent: I have reviewed the patients History and Physical, chart, labs and discussed the procedure including the risks, benefits and alternatives for the proposed anesthesia with the patient or authorized representative who has indicated his/her understanding and acceptance.      Dental Advisory Given  Plan Discussed with: CRNA and Surgeon  Anesthesia Plan Comments:         Anesthesia Quick Evaluation

## 2022-06-05 NOTE — Op Note (Signed)
[facility] Gastroenterology Patient Name: [name] Procedure Date: 06/05/2022 7:54 AM MRN: [id number] Account #: [id number] Date of Birth: 1966-02-19 Admit Type: Outpatient Age: 56 Room: [facility] ENDO ROOM 2 Gender: Female Note Status: Finalized Instrument Name: Park Meo [id number] Procedure:             Colonoscopy Indications:           Positive Cologuard test Providers:             Jonathon Bellows MD, MD Medicines:             Monitored Anesthesia Care Complications:         No immediate complications. Procedure:             Pre-Anesthesia Assessment:                        - Prior to the procedure, a History and Physical was                         performed, and patient medications, allergies and                         sensitivities were reviewed. The patient's tolerance                         of previous anesthesia was reviewed.                        - The risks and benefits of the procedure and the                         sedation options and risks were discussed with the                         patient. All questions were answered and informed                         consent was obtained.                        - ASA Grade Assessment: II - A patient with mild                         systemic disease.                        After obtaining informed consent, the colonoscope was                         passed under direct vision. Throughout the procedure,                         the patient's blood pressure, pulse, and oxygen                         saturations were monitored continuously. The                         Colonoscope was introduced through the anus and  advanced to the the cecum, identified by the                         appendiceal orifice. The colonoscopy was performed                         with ease. The patient tolerated the procedure well.                         The quality of the bowel preparation was  good. Findings:      The perianal and digital rectal examinations were normal.      Multiple small and large-mouthed diverticula were found in the sigmoid       colon.      Three sessile polyps were found in the transverse colon. The polyps were       5 to 8 mm in size. These polyps were removed with a cold snare.       Resection and retrieval were complete.      The exam was otherwise without abnormality on direct and retroflexion       views. Impression:            - Diverticulosis in the sigmoid colon.                        - Three 5 to 8 mm polyps in the transverse colon,                         removed with a cold snare. Resected and retrieved.                        - The examination was otherwise normal on direct and                         retroflexion views. Recommendation:        - Discharge patient to home (with escort).                        - Resume previous diet.                        - Continue present medications.                        - Await pathology results.                        - Repeat colonoscopy in 3 years for surveillance. Procedure Code(s):     --- Professional ---                        [phone], Colonoscopy, flexible; with removal of                         tumor(s), polyp(s), or other lesion(s) by snare                         technique Diagnosis Code(s):     --- Professional ---  K63.5, Polyp of colon                        R19.5, Other fecal abnormalities                        K57.30, Diverticulosis of large intestine without                         perforation or abscess without bleeding CPT copyright 2019 American Medical Association. All rights reserved. The codes documented in this report are preliminary and upon coder review may  be revised to meet current compliance requirements. Jonathon Bellows, MD Jonathon Bellows MD, MD 06/05/2022 8:12:29 AM This report has been signed electronically. Number of Addenda: 0 Note Initiated On:  06/05/2022 7:54 AM Scope Withdrawal Time: 0 hours 9 minutes 35 seconds  Total Procedure Duration: 0 hours 12 minutes 25 seconds  Estimated Blood Loss:  Estimated blood loss: none.      [facility]

## 2022-06-05 NOTE — Transfer of Care (Signed)
Immediate Anesthesia Transfer of Care Note  Patient: [name]  Procedure(s) Performed: COLONOSCOPY WITH PROPOFOL  Patient Location: PACU  Anesthesia Type:General  Level of Consciousness: sedated  Airway & Oxygen Therapy: Patient Spontanous Breathing  Post-op Assessment: Report given to RN and Post -op Vital signs reviewed and stable  Post vital signs: Reviewed and stable  Last Vitals:  Vitals Value Taken Time  BP 123/72 06/05/22 0814  Temp    Pulse 79 06/05/22 0814  Resp 24 06/05/22 0814  SpO2 99 % 06/05/22 0814  Vitals shown include unvalidated device data.  Last Pain:  Vitals:   06/05/22 0705  TempSrc: Temporal  PainSc: 0-No pain         Complications: No notable events documented.

## 2022-06-05 NOTE — Anesthesia Postprocedure Evaluation (Signed)
Anesthesia Post Note  Patient: [name]  Procedure(s) Performed: COLONOSCOPY WITH PROPOFOL  Patient location during evaluation: PACU Anesthesia Type: General Level of consciousness: awake and awake and alert Pain management: satisfactory to patient Vital Signs Assessment: post-procedure vital signs reviewed and stable Respiratory status: spontaneous breathing and nonlabored ventilation Cardiovascular status: stable Anesthetic complications: no   No notable events documented.   Last Vitals:  Vitals:   06/05/22 0814 06/05/22 0824  BP:  128/81  Pulse:    Resp:    Temp: (!) 36.4 C   SpO2:      Last Pain:  Vitals:   06/05/22 0834  TempSrc:   PainSc: 0-No pain                 VAN STAVEREN,Perkins Molina

## 2022-06-05 NOTE — H&P (Signed)
Janice Bellows, MD [address] [place], [place], [place], [address] [address], [place], [place], [place], [address] Phone: [phone]  Fax: [phone]  Primary Care Physician:  Verlin Dike, MD   Pre-Procedure History & Physical: HPI:  [name] is a 56 y.o. female is here for an colonoscopy.   Past Medical History:  Diagnosis Date   Elevated lipids    Family history of breast cancer 03/24/2014   gen testing done but not sure of results; 11/19 update testing letter sent    Past Surgical History:  Procedure Laterality Date   NO PAST SURGERIES     WISDOM TOOTH EXTRACTION      Prior to Admission medications   Medication Sig Start Date End Date Taking? Authorizing Provider  ibuprofen (ADVIL,MOTRIN) 200 MG tablet Take 2 tablets by mouth as needed.    [provider]  levonorgestrel (MIRENA) 20 MCG/24HR IUD 1 each by Intrauterine route once. Patient not taking: Reported on 06/05/2022    [provider]    Allergies as of 05/21/2022 - Review Complete 05/21/2022  Allergen Reaction Noted   Penicillin g Hives 07/18/2015   Sulfamethoxazole-trimethoprim Rash 07/18/2015    Family History  Problem Relation Age of Onset   Breast cancer Paternal Aunt 71   Hyperlipidemia Mother    Hypertension Mother    Hyperlipidemia Father    Pancreatic cancer Father 64   Hypertension Brother    Liver disease Brother     Social History   Socioeconomic History   Marital status: Married    Spouse name: Not on file   Number of children: 2   Years of education: 16   Highest education level: Not on file  Occupational History   Occupation: HOMEMAKER  Tobacco Use   Smoking status: Former    Types: Cigarettes    Quit date: 10/1994    Years since quitting: 27.6   Smokeless tobacco: Never  Vaping Use   Vaping Use: Never used  Substance and Sexual Activity   Alcohol use: Yes    Comment: occ   Drug use: Never   Sexual activity: Yes     Birth control/protection: Other-see comments, I.U.D.    Comment: perimenopausal, Mirena  Other Topics Concern   Not on file  Social History Narrative   Not on file   Social Determinants of Health   Financial Resource Strain: Not on file  Food Insecurity: Not on file  Transportation Needs: Not on file  Physical Activity: Not on file  Stress: Not on file  Social Connections: Not on file  Intimate Partner Violence: Not on file    Review of Systems: See HPI, otherwise negative ROS  Physical Exam: BP (!) 145/82   Pulse 81   Temp 97.6 F (36.4 C) (Temporal)   Resp 18   Ht '5\' 5"'$  (1.651 m)   Wt 81.6 kg   LMP  (LMP Unknown)   SpO2 98%   BMI 29.95 kg/m  General:   Alert,  pleasant and cooperative in NAD Head:  Normocephalic and atraumatic. Neck:  Supple; no masses or thyromegaly. Lungs:  Clear throughout to auscultation, normal respiratory effort.    Heart:  +S1, +S2, Regular rate and rhythm, No edema. Abdomen:  Soft, nontender and nondistended. Normal bowel sounds, without guarding, and without rebound.   Neurologic:  Alert and  oriented x4;  grossly normal neurologically.  Impression/Plan: [name] is here for an colonoscopy to be performed for positive cologuard  Risks, benefits, limitations, and alternatives regarding  colonoscopy have been reviewed with the patient.  Questions have been answered.  All parties agreeable.   Janice Bellows, MD  06/05/2022, 7:51 AM

## 2022-06-06 ENCOUNTER — Encounter: Payer: Self-pay | Admitting: Gastroenterology

## 2022-06-06 LAB — SURGICAL PATHOLOGY

## 2023-01-08 ENCOUNTER — Other Ambulatory Visit: Payer: Self-pay | Admitting: Obstetrics and Gynecology

## 2023-01-08 DIAGNOSIS — Z1231 Encounter for screening mammogram for malignant neoplasm of breast: Secondary | ICD-10-CM

## 2023-02-17 ENCOUNTER — Ambulatory Visit
Admission: RE | Admit: 2023-02-17 | Discharge: 2023-02-17 | Disposition: A | Discharge status: Home / Self Care | Payer: Medicaid Other | Source: Ambulatory Visit | Attending: Obstetrics and Gynecology | Admitting: Obstetrics and Gynecology

## 2023-02-17 DIAGNOSIS — Z1231 Encounter for screening mammogram for malignant neoplasm of breast: Secondary | ICD-10-CM | POA: Insufficient documentation

## 2023-04-15 ENCOUNTER — Other Ambulatory Visit: Payer: Self-pay | Admitting: Podiatry

## 2023-04-15 ENCOUNTER — Ambulatory Visit: Payer: Medicaid Other | Admitting: Podiatry

## 2023-04-15 ENCOUNTER — Ambulatory Visit (INDEPENDENT_AMBULATORY_CARE_PROVIDER_SITE_OTHER): Payer: Medicaid Other

## 2023-04-15 DIAGNOSIS — Z01818 Encounter for other preprocedural examination: Secondary | ICD-10-CM

## 2023-04-15 DIAGNOSIS — M21611 Bunion of right foot: Secondary | ICD-10-CM | POA: Diagnosis not present

## 2023-04-15 DIAGNOSIS — G5762 Lesion of plantar nerve, left lower limb: Secondary | ICD-10-CM | POA: Diagnosis not present

## 2023-04-15 DIAGNOSIS — M21612 Bunion of left foot: Secondary | ICD-10-CM | POA: Diagnosis not present

## 2023-04-15 DIAGNOSIS — M21619 Bunion of unspecified foot: Secondary | ICD-10-CM

## 2023-04-15 NOTE — Progress Notes (Signed)
Subjective:  Patient ID: [name], female    DOB: Apr 01, 1966,  MRN: [id number]  Chief Complaint  Patient presents with   Numbness    Left foot 2nd toe numbness Right foot bunion     57 y.o. female presents with the above complaint.  Patient presents with complaint of right severe bunion deformity and right second digit hammertoe contracture.  Patient states pain for touch is progressive and worse worse with ambulation worse with pressure she would like to discuss treatment options for this.  She has not seen anyone else prior to seeing me.  She denies any other acute complaints.  She has tried all conservative treatment options from shoe gear modification padding protecting offloading none of which has helped.  She would like to discuss surgical options at this time  Review of Systems: Negative except as noted in the HPI. Denies N/V/F/Ch.  Past Medical History:  Diagnosis Date   Elevated lipids    Family history of breast cancer 03/24/2014   gen testing done but not sure of results; 11/19 update testing letter sent    Current Outpatient Medications:    levocetirizine (XYZAL) 5 MG tablet, 1 tablet in the evening Orally Once a day for 30 days, Disp: , Rfl:    fluticasone (FLONASE) 50 MCG/ACT nasal spray, 1-2 sprays Nasally Once a day for 30 days, Disp: , Rfl:    ibuprofen (ADVIL,MOTRIN) 200 MG tablet, Take 2 tablets by mouth as needed., Disp: , Rfl:    levonorgestrel (MIRENA) 20 MCG/24HR IUD, 1 each by Intrauterine route once. (Patient not taking: Reported on 06/05/2022), Disp: , Rfl:   Social History   Tobacco Use  Smoking Status Former   Types: Cigarettes   Quit date: 10/1994   Years since quitting: 28.5  Smokeless Tobacco Never    Allergies  Allergen Reactions   Penicillin G Hives   Sulfamethoxazole-Trimethoprim Rash   Objective:  There were no vitals filed for this visit. There is no height or weight on file to calculate BMI. Constitutional Well  developed. Well nourished.  Vascular Dorsalis pedis pulses palpable bilaterally. Posterior tibial pulses palpable bilaterally. Capillary refill normal to all digits.  No cyanosis or clubbing noted. Pedal hair growth normal.  Neurologic Normal speech. Oriented to person, place, and time. Epicritic sensation to light touch grossly present bilaterally.  Dermatologic Nails well groomed and normal in appearance. No open wounds. No skin lesions.  Left second interspace neuroma with positive Mulder's click and Lendell Caprice sign noted  Orthopedic: Normal joint ROM without pain or crepitus bilaterally. Hallux abductovalgus deformity present.  No intra-articular first MPJ pain noted.  This is a track bound not a tracking deformity Left 1st MPJ diminished range of motion. Left 1st TMT without gross hypermobility. Right 1st MPJ diminished range of motion  Right 1st TMT with gross hypermobility. Lesser digital contractures present right.  Second digit hammertoe semiflexible   Radiographs: Taken and reviewed. Hallux abductovalgus deformity present. Metatarsal parabola normal. 1st/2nd IMA: Severe greater than 15 degrees; TSP: 6 out of 7  Assessment:   1. Bunion   2. Encounter for preoperative examination for general surgical procedure    Plan:  Patient was evaluated and treated and all questions answered.  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 -Patient has failed all conservative therapy and wishes to proceed with surgical intervention. All risks, benefits, and alternatives discussed with patient. No guarantees given. Consent reviewed and signed by patient. Post-op course explained  at length. -Planned procedures: Lapidus bunionectomy and possible phalangeal osteotomy with second digit hammertoe arthroplasty -Risk factors: None -I discussed my preoperative intra postop plan with the patient in extensive detail given the amount of pain  that she is having she will benefit from Lapidus bunionectomy with possible phalangeal osteotomy and second digit hammertoe contracture with fixation.  I discussed with patient she states that she would like would like to proceed with surgery -Informed surgical risk consent was reviewed and read aloud to the patient.  I reviewed the films.  I have discussed my findings with the patient in great detail.  I have discussed all risks including but not limited to infection, stiffness, scarring, limp, disability, deformity, damage to blood vessels and nerves, numbness, poor healing, need for braces, arthritis, chronic pain, amputation, death.  All benefits and realistic expectations discussed in great detail.  I have made no promises as to the outcome.  I have provided realistic expectations.  I have offered the patient a 2nd opinion, which they have declined and assured me they preferred to proceed despite the risks  Left second interspace neuroma -I explained to patient the etiology of neuroma given the amount of inflammation that is present patient will benefit from steroid injection help decrease acute inflammatory component associated pain.  Patient agrees with plan like to proceed with steroid injection   No follow-ups on file.

## 2023-04-16 ENCOUNTER — Telehealth: Payer: Self-pay

## 2023-04-16 NOTE — Telephone Encounter (Signed)
Received surgery paperwork from the [place] office. Left a message for Alonie to call and schedule surgery with Dr. Allena Katz.

## 2023-05-27 ENCOUNTER — Encounter: Payer: Self-pay | Admitting: Podiatry

## 2023-06-12 ENCOUNTER — Ambulatory Visit: Payer: Medicaid Other | Admitting: Podiatry

## 2023-06-12 DIAGNOSIS — M7752 Other enthesopathy of left foot: Secondary | ICD-10-CM

## 2023-06-12 NOTE — Progress Notes (Signed)
Subjective:  Patient ID: [name], female    DOB: June 11, 1966,  MRN: [id number]  Chief Complaint  Patient presents with   Injections    57 y.o. female presents with the above complaint.  Patient presents with follow-up of right severe bunion deformity and right second digit hammertoe.  She states the site is doing okay she is working on getting timeframe for the surgery.  She states the left side is causing some issues for 2 fibrillation worse with pressure she denies any other acute complaints.  Review of Systems: Negative except as noted in the HPI. Denies N/V/F/Ch.  Past Medical History:  Diagnosis Date   Elevated lipids    Family history of breast cancer 03/24/2014   gen testing done but not sure of results; 11/19 update testing letter sent    Current Outpatient Medications:    fluticasone (FLONASE) 50 MCG/ACT nasal spray, 1-2 sprays Nasally Once a day for 30 days, Disp: , Rfl:    ibuprofen (ADVIL,MOTRIN) 200 MG tablet, Take 2 tablets by mouth as needed., Disp: , Rfl:    levocetirizine (XYZAL) 5 MG tablet, 1 tablet in the evening Orally Once a day for 30 days, Disp: , Rfl:    levonorgestrel (MIRENA) 20 MCG/24HR IUD, 1 each by Intrauterine route once. (Patient not taking: Reported on 06/05/2022), Disp: , Rfl:   Social History   Tobacco Use  Smoking Status Former   Current packs/day: 0.00   Types: Cigarettes   Quit date: 10/1994   Years since quitting: 28.6  Smokeless Tobacco Never    Allergies  Allergen Reactions   Penicillin G Hives   Sulfamethoxazole-Trimethoprim Rash   Objective:  There were no vitals filed for this visit. There is no height or weight on file to calculate BMI. Constitutional Well developed. Well nourished.  Vascular Dorsalis pedis pulses palpable bilaterally. Posterior tibial pulses palpable bilaterally. Capillary refill normal to all digits.  No cyanosis or clubbing noted. Pedal hair growth normal.  Neurologic Normal  speech. Oriented to person, place, and time. Epicritic sensation to light touch grossly present bilaterally.  Dermatologic Nails well groomed and normal in appearance. No open wounds. No skin lesions.  Left second interspace neuroma with positive Mulder's click and Lendell Caprice sign noted  Orthopedic: Normal joint ROM without pain or crepitus bilaterally. Hallux abductovalgus deformity present.  No intra-articular first MPJ pain noted.  This is a track bound not a tracking deformity Left 1st MPJ diminished range of motion. Left 1st TMT without gross hypermobility. Right 1st MPJ diminished range of motion  Right 1st TMT with gross hypermobility. Lesser digital contractures present right.  Second digit hammertoe semiflexible   Radiographs: Taken and reviewed. Hallux abductovalgus deformity present. Metatarsal parabola normal. 1st/2nd IMA: Severe greater than 15 degrees; TSP: 6 out of 7  Assessment:   No diagnosis found.  Plan:  Patient was evaluated and treated and all questions answered.  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 -Patient has failed all conservative therapy and wishes to proceed with surgical intervention. All risks, benefits, and alternatives discussed with patient. No guarantees given. Consent reviewed and signed by patient. Post-op course explained at length. -Planned procedures: Lapidus bunionectomy and possible phalangeal osteotomy with second digit hammertoe arthroplasty -Risk factors: None -I discussed my preoperative intra postop plan with the patient in extensive detail given the amount of pain that she is having she will benefit from Lapidus bunionectomy with possible phalangeal osteotomy and second digit hammertoe contracture  with fixation.  I discussed with patient she states that she would like would like to proceed with surgery -Informed surgical risk consent was reviewed and read aloud to the  patient.  I reviewed the films.  I have discussed my findings with the patient in great detail.  I have discussed all risks including but not limited to infection, stiffness, scarring, limp, disability, deformity, damage to blood vessels and nerves, numbness, poor healing, need for braces, arthritis, chronic pain, amputation, death.  All benefits and realistic expectations discussed in great detail.  I have made no promises as to the outcome.  I have provided realistic expectations.  I have offered the patient a 2nd opinion, which they have declined and assured me they preferred to proceed despite the risks  Left second interspace neuroma/capsulitis -I explained to patient the etiology of neuroma given the amount of inflammation that is present patient will benefit from steroid injection help decrease acute inflammatory component associated pain.  Patient agrees with plan like to proceed with steroid injection -Second steroid injection was performed at left second MTP using 1% plain Lidocaine and 10 mg of Kenalog. This was well tolerated.    No follow-ups on file.

## 2023-07-13 DIAGNOSIS — Z1371 Encounter for nonprocreative screening for genetic disease carrier status: Secondary | ICD-10-CM

## 2023-07-13 HISTORY — DX: Encounter for nonprocreative screening for genetic disease carrier status: Z13.71

## 2023-07-15 NOTE — Progress Notes (Unsigned)
PCP: Resa Miner, MD   No chief complaint on file.   HPI:      Ms. [name] is a 57 y.o. [phone] whose LMP was No LMP recorded (lmp unknown). Patient is postmenopausal., presents today for her annual examination. Her menses are absent with IUD. No BTB, dysmen.  Mirena placed 05/04/14. Discussed removal last yr vs waiting till this yr since had 8 yr indication. Pt elected to keep it in 1 more yr. Due for removal this yr.  Sex activity: single partner, occas sex active; contraception - IUD. She does not have vaginal dryness.  Last Pap: 12/20/20 Results were: no abnormalities /neg HPV DNA  Hx of STDs: HPV on pap  Last mammogram: 02/17/23  Results were: normal--routine follow-up in 12 months after addl views There is a FH of breast cancer in her pat aunt and pancreatic cancer in her dad. Her aunt had neg gene testing yrs ago and her dad had some type of testing. Pt has letter at home with info; pt declined genetic testing last yr. There is no FH of ovarian cancer. The patient does do self-breast exams.   Colonoscopy: 06/05/22 with polyps at [facility], repeat after 3 yrs  Tobacco use: The patient denies current or previous tobacco use. Alcohol use: social drinker  No drug use Exercise: moderately active  She does get adequate calcium and Vitamin D in her diet.  Hx of pre-DM and borderline lipids. Never got PCP. Due for repeat labs.    Past Medical History:  Diagnosis Date   Elevated lipids    Family history of breast cancer 03/24/2014   gen testing done but not sure of results; 11/19 update testing letter sent    Past Surgical History:  Procedure Laterality Date   COLONOSCOPY WITH PROPOFOL N/A 06/05/2022   Procedure: COLONOSCOPY WITH PROPOFOL;  Surgeon: Wyline Mood, MD;  Location: [facility];  Service: Gastroenterology;  Laterality: N/A;   NO PAST SURGERIES     WISDOM TOOTH EXTRACTION      Family History  Problem Relation Age of Onset   Breast cancer  Paternal Aunt 18   Hyperlipidemia Mother    Hypertension Mother    Hyperlipidemia Father    Pancreatic cancer Father 19   Hypertension Brother    Liver disease Brother     Social History   Socioeconomic History   Marital status: Married    Spouse name: Not on file   Number of children: 2   Years of education: 16   Highest education level: Not on file  Occupational History   Occupation: HOMEMAKER  Tobacco Use   Smoking status: Former    Current packs/day: 0.00    Types: Cigarettes    Quit date: 10/1994    Years since quitting: 28.7   Smokeless tobacco: Never  Vaping Use   Vaping status: Never Used  Substance and Sexual Activity   Alcohol use: Yes    Comment: occ   Drug use: Never   Sexual activity: Yes    Birth control/protection: Other-see comments, I.U.D.    Comment: perimenopausal, Mirena  Other Topics Concern   Not on file  Social History Narrative   Not on file   Social Determinants of Health   Financial Resource Strain: Not on file  Food Insecurity: Not on file  Transportation Needs: Not on file  Physical Activity: Not on file  Stress: Not on file  Social Connections: Not on file  Intimate Partner Violence: Not on file  Current Outpatient Medications:    fluticasone (FLONASE) 50 MCG/ACT nasal spray, 1-2 sprays Nasally Once a day for 30 days, Disp: , Rfl:    ibuprofen (ADVIL,MOTRIN) 200 MG tablet, Take 2 tablets by mouth as needed., Disp: , Rfl:    levocetirizine (XYZAL) 5 MG tablet, 1 tablet in the evening Orally Once a day for 30 days, Disp: , Rfl:    levonorgestrel (MIRENA) 20 MCG/24HR IUD, 1 each by Intrauterine route once. (Patient not taking: Reported on 06/05/2022), Disp: , Rfl:      ROS:  Review of Systems  Constitutional:  Negative for fatigue, fever and unexpected weight change.  Respiratory:  Negative for cough, shortness of breath and wheezing.   Cardiovascular:  Negative for chest pain, palpitations and leg swelling.   Gastrointestinal:  Negative for blood in stool, constipation, diarrhea, nausea and vomiting.  Endocrine: Negative for cold intolerance, heat intolerance and polyuria.  Genitourinary:  Negative for dyspareunia, dysuria, flank pain, frequency, genital sores, hematuria, menstrual problem, pelvic pain, urgency, vaginal bleeding, vaginal discharge and vaginal pain.  Musculoskeletal:  Negative for back pain, joint swelling and myalgias.  Skin:  Negative for rash.  Neurological:  Negative for dizziness, syncope, light-headedness, numbness and headaches.  Hematological:  Negative for adenopathy.  Psychiatric/Behavioral:  Negative for agitation, confusion, sleep disturbance and suicidal ideas. The patient is not nervous/anxious.   BREAST: No symptoms   Objective: LMP  (LMP Unknown)    Physical Exam Constitutional:      Appearance: She is well-developed.  Genitourinary:     Vulva normal.     Right Labia: No rash, tenderness or lesions.    Left Labia: No tenderness, lesions or rash.    No vaginal discharge, erythema or tenderness.      Right Adnexa: not tender and no mass present.    Left Adnexa: not tender and no mass present.    No cervical friability or polyp.     IUD strings visualized.     Uterus is not enlarged or tender.  Breasts:    Right: No mass, nipple discharge, skin change or tenderness.     Left: No mass, nipple discharge, skin change or tenderness.  Neck:     Thyroid: No thyromegaly.  Cardiovascular:     Rate and Rhythm: Normal rate and regular rhythm.     Heart sounds: Normal heart sounds. No murmur heard. Pulmonary:     Effort: Pulmonary effort is normal.     Breath sounds: Normal breath sounds.  Abdominal:     Palpations: Abdomen is soft.     Tenderness: There is no abdominal tenderness. There is no guarding or rebound.  Musculoskeletal:        General: Normal range of motion.     Cervical back: Normal range of motion.  Lymphadenopathy:     Cervical: No  cervical adenopathy.  Neurological:     General: No focal deficit present.     Mental Status: She is alert and oriented to person, place, and time.     Cranial Nerves: No cranial nerve deficit.  Skin:    General: Skin is warm and dry.  Psychiatric:        Mood and Affect: Mood normal.        Behavior: Behavior normal.        Thought Content: Thought content normal.        Judgment: Judgment normal.  Vitals reviewed.    IUD Removal Strings of IUD identified and grasped.  IUD removed without  problem with ring forceps.  Pt tolerated this well.  IUD noted to be intact. She was amenable to this plan.   Assessment/Plan:  Encounter for annual routine gynecological examination  Encounter for screening mammogram for malignant neoplasm of breast - Plan: MM 3D SCREEN BREAST BILATERAL; pt to schedule mammo  Family history of breast cancer - Plan: MM 3D SCREEN BREAST BILATERAL; MyRisk testing discussed, pt to f/u if desires.   Screening for colon cancer - Plan: Cologuard; colonoscopy/cologuard discussed. Pt elects cologuard. Ref sent. Will f/u with results.  Blood tests for routine general physical examination - Plan: Comprehensive metabolic panel, Lipid panel  Screening cholesterol level - Plan: Lipid panel  Encounter for IUD removal--tolerated well. F/u prn bleeding          GYN counsel breast self exam, mammography screening, menopause, adequate intake of calcium and vitamin D, diet and exercise    F/U  No follow-ups on file.  Hillery Zachman B. Irvine Glorioso, PA-C 07/15/2023 4:52 PM

## 2023-07-17 ENCOUNTER — Encounter: Payer: Self-pay | Admitting: Obstetrics and Gynecology

## 2023-07-17 ENCOUNTER — Ambulatory Visit (INDEPENDENT_AMBULATORY_CARE_PROVIDER_SITE_OTHER): Payer: Medicaid Other | Admitting: Obstetrics and Gynecology

## 2023-07-17 VITALS — BP 100/70 | Ht 66.0 in | Wt 189.0 lb

## 2023-07-17 DIAGNOSIS — Z01419 Encounter for gynecological examination (general) (routine) without abnormal findings: Secondary | ICD-10-CM

## 2023-07-17 DIAGNOSIS — Z30432 Encounter for removal of intrauterine contraceptive device: Secondary | ICD-10-CM

## 2023-07-17 DIAGNOSIS — Z803 Family history of malignant neoplasm of breast: Secondary | ICD-10-CM

## 2023-07-17 DIAGNOSIS — Z30431 Encounter for routine checking of intrauterine contraceptive device: Secondary | ICD-10-CM

## 2023-07-17 DIAGNOSIS — Z800 Family history of malignant neoplasm of digestive organs: Secondary | ICD-10-CM | POA: Insufficient documentation

## 2023-07-17 DIAGNOSIS — Z1231 Encounter for screening mammogram for malignant neoplasm of breast: Secondary | ICD-10-CM

## 2023-07-17 NOTE — Patient Instructions (Signed)
I value your feedback and you entrusting us with your care. If you get a [facility] patient survey, I would appreciate you taking the time to let us know about your experience today. Thank you! ? ? ?

## 2023-07-29 ENCOUNTER — Encounter: Payer: Self-pay | Admitting: Obstetrics and Gynecology

## 2023-09-16 ENCOUNTER — Encounter: Payer: Self-pay | Admitting: Obstetrics and Gynecology

## 2023-09-16 ENCOUNTER — Telehealth: Payer: Self-pay | Admitting: Obstetrics and Gynecology

## 2023-09-16 NOTE — Telephone Encounter (Signed)
[facility] 08/05/23 and 09/09/23 re: MyRisk results. No increased screening recommendations, pt current on mammo. Pt never called back so results and letter mailed to pt.   September 16, 2023     Masayo Fera [address] [place] [address]     Dear Ms. Dimas Aguas,   I have tried to reach you several times regarding your cancer genetic testing results from September 2024. Your tests show you are negative for 48 genes related to breast, ovarian and colon cancers. This doesn't mean you will never get any of these cancers, but you don't have a genetic mutation that significantly increases your risk of these cancers. Two computer models calculate your lifetime risk of breast cancer as 12% and 18% (the general population risk for women your age is 10%). Once a woman's lifetime risk is over 20%, then additional breast cancer screening options are recommended, but thankfully, this doesn't apply to you. It is recommended to continue monthly self-breast exams and yearly mammograms, however.   This result also means your children are negative, at least the 50% of their DNA shared with you. Others on your paternal side still qualify for cancer genetic testing given the family history because if there is a gene present, each person has a 50/50 chance of getting it.    Please let me know if you have any further questions after reviewing your results.      Sincerely,       Althea Grimmer, PA-C

## 2024-02-19 ENCOUNTER — Ambulatory Visit
Admission: RE | Admit: 2024-02-19 | Discharge: 2024-02-19 | Disposition: A | Discharge status: Home / Self Care | Payer: Medicaid Other | Source: Ambulatory Visit | Attending: Obstetrics and Gynecology | Admitting: Obstetrics and Gynecology

## 2024-02-19 DIAGNOSIS — Z1231 Encounter for screening mammogram for malignant neoplasm of breast: Secondary | ICD-10-CM | POA: Diagnosis present

## 2024-02-26 ENCOUNTER — Encounter: Payer: Self-pay | Admitting: Obstetrics and Gynecology

## 2024-10-17 NOTE — Progress Notes (Unsigned)
 PCP: Janice Tawni KIDD, MD   No chief complaint on file.   HPI:      Ms. [name] is a 58 y.o. [phone] whose LMP was No LMP recorded (lmp unknown). Patient is postmenopausal., presents today for her annual examination. Her menses are absent since IUD removal last yr, no PMB. Has tolerable vasomotor sx.   Sex activity: single partner, occas sex active; contraception - post menopause. She does not have vaginal dryness/pain/bleeding.  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 Her aunt had neg gene testing yrs ago and her dad had some type of testing. She is MyRisk neg 11/24. IBIS=18%/riskscore=12%. There is no FH of ovarian cancer. The patient does do self-breast exams.   Colonoscopy: 4/23 positive Cologuard with 06/05/22 colonoscopy with polyps at [facility], repeat after 3 yrs  Tobacco use: The patient denies current or previous tobacco use. Alcohol use: social drinker  No drug use Exercise: moderately active  She does get adequate calcium and Vitamin D in her diet.  Labs with PCP   Past Medical History:  Diagnosis Date   BRCA negative 07/2023   MyRisk neg   Elevated lipids    Family history of breast cancer 03/24/2014   IBIS=18%/riskscore=12%    Past Surgical History:  Procedure Laterality Date   COLONOSCOPY WITH PROPOFOL  N/A 06/05/2022   Procedure: COLONOSCOPY WITH PROPOFOL ;  Surgeon: Therisa Bi, MD;  Location: [facility] ENDOSCOPY;  Service: Gastroenterology;  Laterality: N/A;   WISDOM TOOTH EXTRACTION      Family History  Problem Relation Age of Onset   Breast cancer Paternal Aunt 49   Hyperlipidemia Mother    Hypertension Mother    Hyperlipidemia Father    Pancreatic cancer Father 36   Hypertension Brother    Liver disease  Brother     Social History   Socioeconomic History   Marital status: Married    Spouse name: Not on file   Number of children: 2   Years of education: 16   Highest education level: Not on file  Occupational History   Occupation: HOMEMAKER  Tobacco Use   Smoking status: Former    Current packs/day: 0.00    Types: Cigarettes    Quit date: 10/1994    Years since quitting: 30.0   Smokeless tobacco: Never  Vaping Use   Vaping status: Never Used  Substance and Sexual Activity   Alcohol use: Yes    Comment: occ   Drug use: Never   Sexual activity: Yes    Birth control/protection: Other-see comments    Comment: perimenopausal  Other Topics Concern   Not on file  Social History Narrative   Not on file   Social Drivers of Health   Financial Resource Strain: Not on file  Food Insecurity: Not on file  Transportation Needs: Not on file  Physical Activity: Not on file  Stress: Not on file  Social Connections: Not on file  Intimate Partner Violence: Not on file     Current Outpatient Medications:    fluticasone (FLONASE) 50 MCG/ACT nasal spray, 1-2 sprays Nasally Once a day for 30 days, Disp: , Rfl:      ROS:  Review of Systems  Constitutional:  Negative for fatigue, fever and  unexpected weight change.  Respiratory:  Negative for cough, shortness of breath and wheezing.   Cardiovascular:  Negative for chest pain, palpitations and leg swelling.  Gastrointestinal:  Negative for blood in stool, constipation, diarrhea, nausea and vomiting.  Endocrine: Negative for cold intolerance, heat intolerance and polyuria.  Genitourinary:  Negative for dyspareunia, dysuria, flank pain, frequency, genital sores, hematuria, menstrual problem, pelvic pain, urgency, vaginal bleeding, vaginal discharge and vaginal pain.  Musculoskeletal:  Negative for back pain, joint swelling and myalgias.  Skin:  Negative for rash.  Neurological:  Positive for headaches. Negative for dizziness, syncope,  light-headedness and numbness.  Hematological:  Negative for adenopathy.  Psychiatric/Behavioral:  Negative for agitation, confusion, sleep disturbance and suicidal ideas. The patient is not nervous/anxious.   BREAST: No symptoms   Objective: LMP  (LMP Unknown)    Physical Exam Constitutional:      Appearance: She is well-developed.  Genitourinary:     Vulva normal.     Right Labia: No rash, tenderness or lesions.    Left Labia: No tenderness, lesions or rash.    No vaginal discharge, erythema or tenderness.      Right Adnexa: not tender and no mass present.    Left Adnexa: not tender and no mass present.    No cervical friability or polyp.     IUD strings visualized.     Uterus is not enlarged or tender.  Breasts:    Right: No mass, nipple discharge, skin change or tenderness.     Left: No mass, nipple discharge, skin change or tenderness.  Neck:     Thyroid: No thyromegaly.  Cardiovascular:     Rate and Rhythm: Normal rate and regular rhythm.     Heart sounds: Normal heart sounds. No murmur heard. Pulmonary:     Effort: Pulmonary effort is normal.     Breath sounds: Normal breath sounds.  Abdominal:     Palpations: Abdomen is soft.     Tenderness: There is no abdominal tenderness. There is no guarding or rebound.  Musculoskeletal:        General: Normal range of motion.     Cervical back: Normal range of motion.  Lymphadenopathy:     Cervical: No cervical adenopathy.  Neurological:     General: No focal deficit present.     Mental Status: She is alert and oriented to person, place, and time.     Cranial Nerves: No cranial nerve deficit.  Skin:    General: Skin is warm and dry.  Psychiatric:        Mood and Affect: Mood normal.        Behavior: Behavior normal.        Thought Content: Thought content normal.        Judgment: Judgment normal.  Vitals reviewed.     Assessment/Plan:  Encounter for annual routine gynecological examination  Encounter for  screening mammogram for malignant neoplasm of breast - Plan: [facility] 3D SCREENING MAMMOGRAM BILATERAL BREAST; pt to schedule mammo  Family history of breast cancer - Plan: [facility]); MyRisk testing discussed and done today. Will call with results.   Family history of pancreatic cancer - Plan: [facility] ([facility])          GYN counsel breast self exam, mammography screening, menopause, adequate intake of calcium and vitamin D, diet and exercise    F/U  No follow-ups on file.  Jaquaveon Bilal B. Reuven Braver, PA-C 10/17/2024 7:23 PM

## 2024-10-18 ENCOUNTER — Encounter: Payer: Self-pay | Admitting: Obstetrics and Gynecology

## 2024-10-18 ENCOUNTER — Other Ambulatory Visit (HOSPITAL_COMMUNITY)
Admission: RE | Admit: 2024-10-18 | Discharge: 2024-10-18 | Disposition: A | Discharge status: Home / Self Care | Source: Ambulatory Visit | Attending: Obstetrics and Gynecology | Admitting: Obstetrics and Gynecology

## 2024-10-18 ENCOUNTER — Ambulatory Visit: Admitting: Obstetrics and Gynecology

## 2024-10-18 VITALS — BP 145/73 | HR 69 | Ht 65.0 in | Wt 186.0 lb

## 2024-10-18 DIAGNOSIS — Z01419 Encounter for gynecological examination (general) (routine) without abnormal findings: Secondary | ICD-10-CM

## 2024-10-18 DIAGNOSIS — Z800 Family history of malignant neoplasm of digestive organs: Secondary | ICD-10-CM

## 2024-10-18 DIAGNOSIS — Z13220 Encounter for screening for lipoid disorders: Secondary | ICD-10-CM

## 2024-10-18 DIAGNOSIS — Z803 Family history of malignant neoplasm of breast: Secondary | ICD-10-CM

## 2024-10-18 DIAGNOSIS — Z1151 Encounter for screening for human papillomavirus (HPV): Secondary | ICD-10-CM

## 2024-10-18 DIAGNOSIS — Z131 Encounter for screening for diabetes mellitus: Secondary | ICD-10-CM

## 2024-10-18 DIAGNOSIS — Z1231 Encounter for screening mammogram for malignant neoplasm of breast: Secondary | ICD-10-CM

## 2024-10-18 DIAGNOSIS — Z0000 Encounter for general adult medical examination without abnormal findings: Secondary | ICD-10-CM

## 2024-10-18 DIAGNOSIS — Z124 Encounter for screening for malignant neoplasm of cervix: Secondary | ICD-10-CM

## 2024-10-18 NOTE — Patient Instructions (Signed)
 I value your feedback and you entrusting [place] with your care. If you get a [place] patient survey, I would appreciate you taking the time to let us know about your experience today. Thank you! ? ? ?

## 2024-10-18 NOTE — Addendum Note (Signed)
 Addended by: WATT HILA B on: 10/18/2024 12:06 PM   Modules accepted: Orders

## 2024-10-19 ENCOUNTER — Ambulatory Visit: Payer: Self-pay | Admitting: Obstetrics and Gynecology

## 2024-10-19 LAB — COMPREHENSIVE METABOLIC PANEL WITH GFR
ALT: 9 IU/L (ref 0–32)
AST: 24 IU/L (ref 0–40)
Albumin: 4.4 g/dL (ref 3.8–4.9)
Alkaline Phosphatase: 113 IU/L (ref 49–135)
BUN/Creatinine Ratio: 16 (ref 9–23)
BUN: 15 mg/dL (ref 6–24)
Bilirubin Total: 0.6 mg/dL (ref 0.0–1.2)
CO2: 25 mmol/L (ref 20–29)
Calcium: 10.2 mg/dL (ref 8.7–10.2)
Chloride: 103 mmol/L (ref 96–106)
Creatinine, Ser: 0.91 mg/dL (ref 0.57–1.00)
Globulin, Total: 2.6 g/dL (ref 1.5–4.5)
Glucose: 94 mg/dL (ref 70–99)
Potassium: 4.7 mmol/L (ref 3.5–5.2)
Sodium: 140 mmol/L (ref 134–144)
Total Protein: 7 g/dL (ref 6.0–8.5)
eGFR: 74 mL/min/1.73 (ref >59)

## 2024-10-19 LAB — CBC WITH DIFFERENTIAL/PLATELET
Basophils Absolute: 0.1 x10E3/uL (ref 0.0–0.2)
Basos: 1 %
EOS (ABSOLUTE): 0.3 x10E3/uL (ref 0.0–0.4)
Eos: 6 %
Hematocrit: 44.1 % (ref 34.0–46.6)
Hemoglobin: 13.7 g/dL (ref 11.1–15.9)
Immature Grans (Abs): 0 x10E3/uL (ref 0.0–0.1)
Immature Granulocytes: 0 %
Lymphocytes Absolute: 2.1 x10E3/uL (ref 0.7–3.1)
Lymphs: 39 %
MCH: 28.4 pg (ref 26.6–33.0)
MCHC: 31.1 g/dL — ABNORMAL LOW (ref 31.5–35.7)
MCV: 92 fL (ref 79–97)
Monocytes Absolute: 0.6 x10E3/uL (ref 0.1–0.9)
Monocytes: 12 %
Neutrophils Absolute: 2.3 x10E3/uL (ref 1.4–7.0)
Neutrophils: 42 %
Platelets: 380 x10E3/uL (ref 150–450)
RBC: 4.82 x10E6/uL (ref 3.77–5.28)
RDW: 13.5 % (ref 11.7–15.4)
WBC: 5.5 x10E3/uL (ref 3.4–10.8)

## 2024-10-19 LAB — LIPID PANEL
Chol/HDL Ratio: 3.7 ratio (ref 0.0–4.4)
Cholesterol, Total: 235 mg/dL — ABNORMAL HIGH (ref 100–199)
HDL: 63 mg/dL (ref >39)
LDL Chol Calc (NIH): 154 mg/dL — ABNORMAL HIGH (ref 0–99)
Triglycerides: 100 mg/dL (ref 0–149)
VLDL Cholesterol Cal: 18 mg/dL (ref 5–40)

## 2024-10-19 LAB — HEMOGLOBIN A1C
Est. average glucose Bld gHb Est-mCnc: 128 mg/dL
Hgb A1c MFr Bld: 6.1 % — ABNORMAL HIGH (ref 4.8–5.6)

## 2024-10-19 LAB — CYTOLOGY - PAP
Comment: NEGATIVE
Diagnosis: NEGATIVE
High risk HPV: NEGATIVE

## 2025-02-21 ENCOUNTER — Encounter
